# Patient Record
Sex: Male | Born: 1955
Health system: Southern US, Community
[De-identification: ages and names within clinical notes are randomized; demographics above are authoritative.]

## PROBLEM LIST (undated history)

## (undated) DIAGNOSIS — Z87442 Personal history of urinary calculi: Secondary | ICD-10-CM

## (undated) DIAGNOSIS — M1990 Unspecified osteoarthritis, unspecified site: Secondary | ICD-10-CM

## (undated) DIAGNOSIS — I10 Essential (primary) hypertension: Secondary | ICD-10-CM

## (undated) HISTORY — DX: Essential (primary) hypertension: I10

## (undated) HISTORY — PX: KNEE ARTHROSCOPY W/ ACL RECONSTRUCTION: SHX1858

---

## 2001-09-29 ENCOUNTER — Encounter: Payer: Self-pay | Admitting: Urology

## 2001-09-29 ENCOUNTER — Encounter: Admission: RE | Admit: 2001-09-29 | Discharge: 2001-09-29 | Payer: Self-pay | Admitting: Urology

## 2001-10-30 ENCOUNTER — Encounter: Payer: Self-pay | Admitting: Urology

## 2001-10-30 ENCOUNTER — Encounter: Admission: RE | Admit: 2001-10-30 | Discharge: 2001-10-30 | Payer: Self-pay | Admitting: Urology

## 2001-10-31 ENCOUNTER — Encounter: Payer: Self-pay | Admitting: Urology

## 2001-10-31 ENCOUNTER — Encounter: Admission: RE | Admit: 2001-10-31 | Discharge: 2001-10-31 | Payer: Self-pay | Admitting: Urology

## 2001-11-16 ENCOUNTER — Encounter: Payer: Self-pay | Admitting: Urology

## 2001-11-16 ENCOUNTER — Ambulatory Visit (HOSPITAL_BASED_OUTPATIENT_CLINIC_OR_DEPARTMENT_OTHER): Admission: RE | Admit: 2001-11-16 | Discharge: 2001-11-16 | Payer: Self-pay | Admitting: Urology

## 2001-12-04 ENCOUNTER — Encounter: Payer: Self-pay | Admitting: Urology

## 2001-12-04 ENCOUNTER — Encounter: Admission: RE | Admit: 2001-12-04 | Discharge: 2001-12-04 | Payer: Self-pay | Admitting: Urology

## 2001-12-18 ENCOUNTER — Encounter: Payer: Self-pay | Admitting: Urology

## 2001-12-18 ENCOUNTER — Encounter: Admission: RE | Admit: 2001-12-18 | Discharge: 2001-12-18 | Payer: Self-pay | Admitting: Urology

## 2001-12-20 ENCOUNTER — Ambulatory Visit (HOSPITAL_BASED_OUTPATIENT_CLINIC_OR_DEPARTMENT_OTHER): Admission: RE | Admit: 2001-12-20 | Discharge: 2001-12-20 | Payer: Self-pay | Admitting: Urology

## 2001-12-29 ENCOUNTER — Encounter: Admission: RE | Admit: 2001-12-29 | Discharge: 2001-12-29 | Payer: Self-pay | Admitting: Urology

## 2001-12-29 ENCOUNTER — Encounter: Payer: Self-pay | Admitting: Urology

## 2002-01-31 ENCOUNTER — Encounter: Admission: RE | Admit: 2002-01-31 | Discharge: 2002-01-31 | Payer: Self-pay | Admitting: Urology

## 2002-01-31 ENCOUNTER — Encounter: Payer: Self-pay | Admitting: Urology

## 2002-02-19 ENCOUNTER — Encounter: Admission: RE | Admit: 2002-02-19 | Discharge: 2002-02-19 | Payer: Self-pay | Admitting: Urology

## 2002-02-19 ENCOUNTER — Encounter: Payer: Self-pay | Admitting: Urology

## 2002-02-21 ENCOUNTER — Ambulatory Visit (HOSPITAL_BASED_OUTPATIENT_CLINIC_OR_DEPARTMENT_OTHER): Admission: RE | Admit: 2002-02-21 | Discharge: 2002-02-21 | Payer: Self-pay | Admitting: Urology

## 2004-05-23 ENCOUNTER — Emergency Department (HOSPITAL_COMMUNITY): Admission: EM | Admit: 2004-05-23 | Discharge: 2004-05-23 | Payer: Self-pay | Admitting: Emergency Medicine

## 2006-01-26 ENCOUNTER — Ambulatory Visit: Payer: Self-pay | Admitting: Gastroenterology

## 2006-03-22 ENCOUNTER — Ambulatory Visit: Payer: Self-pay | Admitting: Family Medicine

## 2006-04-05 ENCOUNTER — Ambulatory Visit: Payer: Self-pay | Admitting: Family Medicine

## 2006-04-19 ENCOUNTER — Ambulatory Visit: Payer: Self-pay | Admitting: Family Medicine

## 2006-07-05 ENCOUNTER — Emergency Department (HOSPITAL_COMMUNITY): Admission: EM | Admit: 2006-07-05 | Discharge: 2006-07-06 | Payer: Self-pay | Admitting: Emergency Medicine

## 2006-08-03 ENCOUNTER — Ambulatory Visit (HOSPITAL_BASED_OUTPATIENT_CLINIC_OR_DEPARTMENT_OTHER): Admission: RE | Admit: 2006-08-03 | Discharge: 2006-08-03 | Payer: Self-pay | Admitting: Urology

## 2006-09-02 ENCOUNTER — Ambulatory Visit: Payer: Self-pay | Admitting: Family Medicine

## 2006-09-14 ENCOUNTER — Ambulatory Visit: Payer: Self-pay | Admitting: Gastroenterology

## 2006-09-28 ENCOUNTER — Ambulatory Visit: Payer: Self-pay | Admitting: Gastroenterology

## 2006-10-25 ENCOUNTER — Ambulatory Visit: Payer: Self-pay | Admitting: Gastroenterology

## 2006-11-22 ENCOUNTER — Ambulatory Visit: Payer: Self-pay | Admitting: Gastroenterology

## 2006-12-20 ENCOUNTER — Ambulatory Visit: Payer: Self-pay | Admitting: Gastroenterology

## 2007-03-20 ENCOUNTER — Ambulatory Visit: Payer: Self-pay | Admitting: Family Medicine

## 2007-03-20 LAB — CONVERTED CEMR LAB
ALT: 17 units/L (ref 0–40)
AST: 21 units/L (ref 0–37)
Albumin: 3.6 g/dL (ref 3.5–5.2)
Alkaline Phosphatase: 68 units/L (ref 39–117)
BUN: 17 mg/dL (ref 6–23)
Basophils Absolute: 0 10*3/uL (ref 0.0–0.1)
Basophils Relative: 0.6 % (ref 0.0–1.0)
Bilirubin, Direct: 0.1 mg/dL (ref 0.0–0.3)
CO2: 27 meq/L (ref 19–32)
Calcium: 8.9 mg/dL (ref 8.4–10.5)
Chloride: 108 meq/L (ref 96–112)
Cholesterol: 186 mg/dL (ref 0–200)
Creatinine, Ser: 0.9 mg/dL (ref 0.4–1.5)
Direct LDL: 81.3 mg/dL
Eosinophils Absolute: 0.7 10*3/uL — ABNORMAL HIGH (ref 0.0–0.6)
Eosinophils Relative: 9.2 % — ABNORMAL HIGH (ref 0.0–5.0)
GFR calc Af Amer: 114 mL/min
GFR calc non Af Amer: 94 mL/min
Glucose, Bld: 91 mg/dL (ref 70–99)
HCT: 44.5 % (ref 39.0–52.0)
HDL: 35.5 mg/dL — ABNORMAL LOW (ref >39.0)
Hemoglobin: 15.7 g/dL (ref 13.0–17.0)
Lymphocytes Relative: 22.1 % (ref 12.0–46.0)
MCHC: 35.2 g/dL (ref 30.0–36.0)
MCV: 90.9 fL (ref 78.0–100.0)
Monocytes Absolute: 0.7 10*3/uL (ref 0.2–0.7)
Monocytes Relative: 9.6 % (ref 3.0–11.0)
Neutro Abs: 4.1 10*3/uL (ref 1.4–7.7)
Neutrophils Relative %: 58.5 % (ref 43.0–77.0)
PSA: 0.72 ng/mL (ref 0.10–4.00)
Platelets: 265 10*3/uL (ref 150–400)
Potassium: 4.1 meq/L (ref 3.5–5.1)
RBC: 4.9 M/uL (ref 4.22–5.81)
RDW: 11.7 % (ref 11.5–14.6)
Sodium: 141 meq/L (ref 135–145)
TSH: 1.66 microintl units/mL (ref 0.35–5.50)
Total Bilirubin: 0.9 mg/dL (ref 0.3–1.2)
Total CHOL/HDL Ratio: 5.2
Total Protein: 6.9 g/dL (ref 6.0–8.3)
Triglycerides: 388 mg/dL (ref 0–149)
VLDL: 78 mg/dL — ABNORMAL HIGH (ref 0–40)
WBC: 7.1 10*3/uL (ref 4.5–10.5)

## 2007-03-21 ENCOUNTER — Ambulatory Visit: Payer: Self-pay | Admitting: Gastroenterology

## 2007-03-27 ENCOUNTER — Ambulatory Visit: Payer: Self-pay | Admitting: Family Medicine

## 2007-06-21 ENCOUNTER — Ambulatory Visit: Payer: Self-pay | Admitting: Gastroenterology

## 2007-09-14 ENCOUNTER — Ambulatory Visit: Payer: Self-pay | Admitting: Gastroenterology

## 2007-12-11 ENCOUNTER — Ambulatory Visit: Payer: Self-pay | Admitting: Gastroenterology

## 2008-03-25 ENCOUNTER — Ambulatory Visit: Payer: Self-pay | Admitting: Family Medicine

## 2008-03-25 LAB — CONVERTED CEMR LAB
ALT: 20 units/L (ref 0–53)
AST: 20 units/L (ref 0–37)
Albumin: 3.6 g/dL (ref 3.5–5.2)
Alkaline Phosphatase: 68 units/L (ref 39–117)
BUN: 17 mg/dL (ref 6–23)
Basophils Absolute: 0 10*3/uL (ref 0.0–0.1)
Basophils Relative: 0.1 % (ref 0.0–1.0)
Bilirubin Urine: NEGATIVE
Bilirubin, Direct: 0.1 mg/dL (ref 0.0–0.3)
Blood in Urine, dipstick: NEGATIVE
CO2: 28 meq/L (ref 19–32)
Calcium: 9.1 mg/dL (ref 8.4–10.5)
Chloride: 109 meq/L (ref 96–112)
Cholesterol: 186 mg/dL (ref 0–200)
Creatinine, Ser: 1 mg/dL (ref 0.4–1.5)
Direct LDL: 97.7 mg/dL
Eosinophils Absolute: 0.6 10*3/uL (ref 0.0–0.7)
Eosinophils Relative: 8.5 % — ABNORMAL HIGH (ref 0.0–5.0)
GFR calc Af Amer: 101 mL/min
GFR calc non Af Amer: 83 mL/min
Glucose, Bld: 87 mg/dL (ref 70–99)
Glucose, Urine, Semiquant: NEGATIVE
HCT: 46.5 % (ref 39.0–52.0)
HDL: 32.6 mg/dL — ABNORMAL LOW (ref >39.0)
Hemoglobin: 16.1 g/dL (ref 13.0–17.0)
Lymphocytes Relative: 23.1 % (ref 12.0–46.0)
MCHC: 34.6 g/dL (ref 30.0–36.0)
MCV: 93.5 fL (ref 78.0–100.0)
Monocytes Absolute: 0.6 10*3/uL (ref 0.1–1.0)
Monocytes Relative: 8.9 % (ref 3.0–12.0)
Neutro Abs: 4.4 10*3/uL (ref 1.4–7.7)
Neutrophils Relative %: 59.4 % (ref 43.0–77.0)
Nitrite: NEGATIVE
PSA: 0.69 ng/mL (ref 0.10–4.00)
Platelets: 262 10*3/uL (ref 150–400)
Potassium: 4.1 meq/L (ref 3.5–5.1)
Protein, U semiquant: NEGATIVE
RBC: 4.98 M/uL (ref 4.22–5.81)
RDW: 11.8 % (ref 11.5–14.6)
Sodium: 141 meq/L (ref 135–145)
Specific Gravity, Urine: 1.025
TSH: 1.75 microintl units/mL (ref 0.35–5.50)
Total Bilirubin: 0.9 mg/dL (ref 0.3–1.2)
Total CHOL/HDL Ratio: 5.7
Total Protein: 7 g/dL (ref 6.0–8.3)
Triglycerides: 245 mg/dL (ref 0–149)
Urobilinogen, UA: 0.2
VLDL: 49 mg/dL — ABNORMAL HIGH (ref 0–40)
WBC Urine, dipstick: NEGATIVE
WBC: 7.3 10*3/uL (ref 4.5–10.5)
pH: 6

## 2008-04-01 ENCOUNTER — Ambulatory Visit: Payer: Self-pay | Admitting: Family Medicine

## 2008-04-01 DIAGNOSIS — Z87442 Personal history of urinary calculi: Secondary | ICD-10-CM | POA: Insufficient documentation

## 2008-04-01 DIAGNOSIS — I10 Essential (primary) hypertension: Secondary | ICD-10-CM | POA: Insufficient documentation

## 2009-02-28 ENCOUNTER — Ambulatory Visit: Payer: Self-pay | Admitting: Gastroenterology

## 2009-03-14 ENCOUNTER — Ambulatory Visit: Payer: Self-pay | Admitting: Gastroenterology

## 2009-03-14 LAB — HM COLONOSCOPY

## 2009-03-28 ENCOUNTER — Ambulatory Visit: Payer: Self-pay | Admitting: Family Medicine

## 2009-03-28 LAB — CONVERTED CEMR LAB
ALT: 26 units/L (ref 0–53)
AST: 25 units/L (ref 0–37)
Albumin: 3.5 g/dL (ref 3.5–5.2)
Alkaline Phosphatase: 79 units/L (ref 39–117)
BUN: 16 mg/dL (ref 6–23)
Basophils Absolute: 0 10*3/uL (ref 0.0–0.1)
Basophils Relative: 0.3 % (ref 0.0–3.0)
Bilirubin Urine: NEGATIVE
Bilirubin, Direct: 0 mg/dL (ref 0.0–0.3)
CO2: 28 meq/L (ref 19–32)
Calcium: 8.7 mg/dL (ref 8.4–10.5)
Chloride: 109 meq/L (ref 96–112)
Cholesterol: 169 mg/dL (ref 0–200)
Creatinine, Ser: 0.9 mg/dL (ref 0.4–1.5)
Direct LDL: 66.8 mg/dL
Eosinophils Absolute: 0.6 10*3/uL (ref 0.0–0.7)
Eosinophils Relative: 8.6 % — ABNORMAL HIGH (ref 0.0–5.0)
GFR calc non Af Amer: 93.45 mL/min (ref >60)
Glucose, Bld: 88 mg/dL (ref 70–99)
Glucose, Urine, Semiquant: NEGATIVE
HCT: 40.1 % (ref 39.0–52.0)
HDL: 33.9 mg/dL — ABNORMAL LOW (ref >39.00)
Hemoglobin: 13.9 g/dL (ref 13.0–17.0)
Ketones, urine, test strip: NEGATIVE
Lymphocytes Relative: 24.5 % (ref 12.0–46.0)
Lymphs Abs: 1.7 10*3/uL (ref 0.7–4.0)
MCHC: 34.8 g/dL (ref 30.0–36.0)
MCV: 93.8 fL (ref 78.0–100.0)
Monocytes Absolute: 0.7 10*3/uL (ref 0.1–1.0)
Monocytes Relative: 10.2 % (ref 3.0–12.0)
Neutro Abs: 3.9 10*3/uL (ref 1.4–7.7)
Neutrophils Relative %: 56.4 % (ref 43.0–77.0)
Nitrite: NEGATIVE
PSA: 0.57 ng/mL (ref 0.10–4.00)
Platelets: 282 10*3/uL (ref 150.0–400.0)
Potassium: 3.8 meq/L (ref 3.5–5.1)
RBC: 4.27 M/uL (ref 4.22–5.81)
RDW: 12.5 % (ref 11.5–14.6)
Sodium: 143 meq/L (ref 135–145)
Specific Gravity, Urine: 1.015
TSH: 1.42 microintl units/mL (ref 0.35–5.50)
Total Bilirubin: 0.7 mg/dL (ref 0.3–1.2)
Total CHOL/HDL Ratio: 5
Total Protein: 6.8 g/dL (ref 6.0–8.3)
Triglycerides: 342 mg/dL — ABNORMAL HIGH (ref 0.0–149.0)
Urobilinogen, UA: 1
VLDL: 68.4 mg/dL — ABNORMAL HIGH (ref 0.0–40.0)
WBC Urine, dipstick: NEGATIVE
WBC: 6.9 10*3/uL (ref 4.5–10.5)
pH: 7.5

## 2009-04-03 ENCOUNTER — Encounter: Payer: Self-pay | Admitting: Family Medicine

## 2009-04-04 ENCOUNTER — Ambulatory Visit: Payer: Self-pay | Admitting: Family Medicine

## 2009-04-04 DIAGNOSIS — M722 Plantar fascial fibromatosis: Secondary | ICD-10-CM | POA: Insufficient documentation

## 2009-04-29 ENCOUNTER — Ambulatory Visit: Payer: Self-pay | Admitting: Sports Medicine

## 2009-04-29 DIAGNOSIS — M2140 Flat foot [pes planus] (acquired), unspecified foot: Secondary | ICD-10-CM | POA: Insufficient documentation

## 2010-03-06 ENCOUNTER — Ambulatory Visit: Payer: Self-pay | Admitting: Family Medicine

## 2010-03-06 LAB — CONVERTED CEMR LAB
ALT: 22 units/L (ref 0–53)
AST: 22 units/L (ref 0–37)
Albumin: 3.9 g/dL (ref 3.5–5.2)
Alkaline Phosphatase: 73 units/L (ref 39–117)
BUN: 18 mg/dL (ref 6–23)
Basophils Absolute: 0 10*3/uL (ref 0.0–0.1)
Basophils Relative: 0.4 % (ref 0.0–3.0)
Bilirubin Urine: NEGATIVE
Bilirubin, Direct: 0.1 mg/dL (ref 0.0–0.3)
Blood in Urine, dipstick: NEGATIVE
CO2: 28 meq/L (ref 19–32)
Calcium: 9 mg/dL (ref 8.4–10.5)
Chloride: 104 meq/L (ref 96–112)
Cholesterol: 198 mg/dL (ref 0–200)
Creatinine, Ser: 0.9 mg/dL (ref 0.4–1.5)
Direct LDL: 120 mg/dL
Eosinophils Absolute: 0.4 10*3/uL (ref 0.0–0.7)
Eosinophils Relative: 4.6 % (ref 0.0–5.0)
GFR calc non Af Amer: 93.13 mL/min (ref >60)
Glucose, Bld: 84 mg/dL (ref 70–99)
Glucose, Urine, Semiquant: NEGATIVE
HCT: 45 % (ref 39.0–52.0)
HDL: 40.7 mg/dL (ref >39.00)
Hemoglobin: 16 g/dL (ref 13.0–17.0)
Ketones, urine, test strip: NEGATIVE
Lymphocytes Relative: 22.6 % (ref 12.0–46.0)
Lymphs Abs: 2 10*3/uL (ref 0.7–4.0)
MCHC: 35.6 g/dL (ref 30.0–36.0)
MCV: 92.6 fL (ref 78.0–100.0)
Monocytes Absolute: 0.7 10*3/uL (ref 0.1–1.0)
Monocytes Relative: 7.9 % (ref 3.0–12.0)
Neutro Abs: 5.7 10*3/uL (ref 1.4–7.7)
Neutrophils Relative %: 64.5 % (ref 43.0–77.0)
Nitrite: NEGATIVE
PSA: 0.7 ng/mL (ref 0.10–4.00)
Platelets: 267 10*3/uL (ref 150.0–400.0)
Potassium: 4.2 meq/L (ref 3.5–5.1)
RBC: 4.86 M/uL (ref 4.22–5.81)
RDW: 13.1 % (ref 11.5–14.6)
Sodium: 136 meq/L (ref 135–145)
Specific Gravity, Urine: 1.02
TSH: 1.37 microintl units/mL (ref 0.35–5.50)
Total Bilirubin: 0.6 mg/dL (ref 0.3–1.2)
Total CHOL/HDL Ratio: 5
Total Protein: 7.1 g/dL (ref 6.0–8.3)
Triglycerides: 215 mg/dL — ABNORMAL HIGH (ref 0.0–149.0)
Urobilinogen, UA: 0.2
VLDL: 43 mg/dL — ABNORMAL HIGH (ref 0.0–40.0)
WBC Urine, dipstick: NEGATIVE
WBC: 8.8 10*3/uL (ref 4.5–10.5)
pH: 7

## 2010-03-16 ENCOUNTER — Telehealth (INDEPENDENT_AMBULATORY_CARE_PROVIDER_SITE_OTHER): Payer: Self-pay | Admitting: *Deleted

## 2010-03-16 ENCOUNTER — Ambulatory Visit: Payer: Self-pay | Admitting: Family Medicine

## 2010-03-16 DIAGNOSIS — K644 Residual hemorrhoidal skin tags: Secondary | ICD-10-CM | POA: Insufficient documentation

## 2010-03-16 DIAGNOSIS — H9190 Unspecified hearing loss, unspecified ear: Secondary | ICD-10-CM | POA: Insufficient documentation

## 2010-03-30 ENCOUNTER — Ambulatory Visit: Payer: Self-pay | Admitting: Family Medicine

## 2010-03-30 DIAGNOSIS — H6120 Impacted cerumen, unspecified ear: Secondary | ICD-10-CM | POA: Insufficient documentation

## 2010-04-16 ENCOUNTER — Ambulatory Visit: Payer: Self-pay | Admitting: Sports Medicine

## 2010-04-16 DIAGNOSIS — M2040 Other hammer toe(s) (acquired), unspecified foot: Secondary | ICD-10-CM | POA: Insufficient documentation

## 2010-04-16 DIAGNOSIS — R269 Unspecified abnormalities of gait and mobility: Secondary | ICD-10-CM | POA: Insufficient documentation

## 2010-04-16 DIAGNOSIS — M79609 Pain in unspecified limb: Secondary | ICD-10-CM | POA: Insufficient documentation

## 2011-01-05 NOTE — Procedures (Signed)
Summary: Colonoscopy   Colonoscopy  Procedure date:  03/14/2009  Findings:      Location:  [facility].    Procedures Next Due Date:    Colonoscopy: 03/2014  COLONOSCOPY PROCEDURE REPORT  PATIENT:  John Buck, John Buck  MR#:  [id number] BIRTHDATE:   [birth year], 52 yrs. old   GENDER:   male  ENDOSCOPIST:   Barbette Hair. Arlyce Dice, MD Referred by: Eugenio Hoes Tawanna Cooler, M.D.  PROCEDURE DATE:  03/14/2009 PROCEDURE:  Colonoscopy, diagnostic ASA CLASS:   Class I INDICATIONS: family history of colon cancer Father  MEDICATIONS:    Fentanyl 50 mcg IV, Versed 5 mg IV  DESCRIPTION OF PROCEDURE:   After the risks benefits and alternatives of the procedure were thoroughly explained, informed consent was obtained.  Digital rectal exam was performed and revealed no abnormalities.   The LB [id number] endoscope was introduced through the anus and advanced to the cecum, which was identified by both the appendix and ileocecal valve, without limitations.  The quality of the prep was excellent, using MiraLax.  The instrument was then slowly withdrawn as the colon was fully examined. <<PROCEDUREIMAGES>>                        <<OLD IMAGES>>  FINDINGS:  A normal appearing cecum, ileocecal valve, and appendiceal orifice were identified. The ascending, transverse, descending, sigmoid colon, and rectum appeared unremarkable (see image1, image2, image3, image4, image5, image6, image7, [place], [place], image10, [place], [place], and image13).   Retroflexed views in the rectum revealed no abnormalities.    The scope was then withdrawn from the patient and the procedure completed.  COMPLICATIONS:   None  ENDOSCOPIC IMPRESSION:  1) Normal colon RECOMMENDATIONS:  1) Given your significant family history of colon cancer, you should have a repeat colonoscopy in 5 years  REPEAT EXAM:   In 5 year(s) for Colonoscopy.   _______________________________ Barbette Hair. Arlyce Dice, MD  CC: Eugenio Hoes. Tawanna Cooler, MD

## 2011-01-05 NOTE — Assessment & Plan Note (Signed)
Summary: NP WITH L HEEL PAIN X JULY   Vital Signs:  Patient profile:   55 year old male Height:      70 inches Weight:      215 pounds BP sitting:   120 / 80  Vitals Entered By: Lillia Pauls CMA (Apr 29, 2009 2:14 PM)  History of Present Illness: L heel pain since july '09. Saw dr. supple and was treated with cortisone injection which only provided a few weeks of relief.  He saw dr. Tawanna Cooler who sent him to PT at [facility] for past 3 weeks.  He is doing heel stretches, u/s, iontophoresis, but is still symptomatic.  Pain first thing in the morning.  Job requires steel toed shoes at work.  Hx of plantar fasciitis in R foot, currently asymptomatic.  he notes some recent weight gain over the past few months due to inability to exercise due to the pain.  Allergies: 1)  ! Stadol (Butorphanol Tartrate) 2)  ! Dilaudid (Hydromorphone Hcl)  Past History:  Past Medical History:    Hypertension    Nephrolithiasis, hx of     (04/01/2008)  Physical Exam  General:  Well-developed,well-nourished,in no acute distress; alert,appropriate and cooperative throughout examination Msk:  L foot with TTP at base of plantar fascia.  Plantar fascia intact.  Negative calcaneal squeeze.  R foot without any swelling over the plantar fascia.  Leg lengths equal 5/5 hip abductor strength  feet with b/l longitudinal arch collapse.  L foot with splaying between 1st and 2nd toe. 4th metatarsal head subluxed.  R foot with splaying between 2nd and 3rd toes.  4th toe turned inward with metatarsal head subluxed. bilateral morton's callus. No hallux rigidis. Posterior tibialis function intact. Pulses:  2+ dp pulses Extremities:  no edema Neurologic:  intact sensation in both feet Skin:  Intact without suspicious lesions or rashes Additional Exam:  MSK u/s- L plantar fascia 0.70 cm thick with edema, but no tearing.  Tendon thins out as moving distal.  No neovessles.  R plantar fascia 0.50 cm thick with calcaneal spur  noted.  Images have been saved to computer and are available for review.    Impression & Recommendations:  Problem # 1:  PLANTAR FASCIITIS, LEFT (ICD-728.71) Assessment New diagnosis confirmed with ultrasound findings. Continue stretching program and icing.  will add custom orthotics given significant foot breakdown (both longitudinal and transverse arch). Patient was fitted for a : standard, cushioned, semi-rigid orthotic. The orthotic was heated and afterward the patient stood on the orthotic blank positioned on the orthotic stand. The patient was positioned in subtalar neutral position and 10 degrees of ankle dorsiflexion in a weight bearing stance. After completion of molding, a stable base was applied to the orthotic blank. The blank was ground to a stable position for weight bearing. Size: 13 black insert with white stripes Base: large blue foam Posting: none Additional orthotic padding: metatarsal pads bilaterally  Time spent with patient evaluating and preparing orthotics > 45 minutes  Orders: Orthotic Materials, each unit (L3002) US EXTREMITY NON-VASC REAL-TIME IMG (65784)  Problem # 2:  PES PLANUS (ICD-734) Assessment: New  Orthotics will help correct this  Orders: Orthotic Materials, each unit (L3002)  Complete Medication List: 1)  Hydrochlorothiazide 25 Mg Tabs (Hydrochlorothiazide) .... 1/2tab once daily 2)  Aspirin Buffered 325 Mg Tabs (Aspirin buff(mgcarb-alaminoac)) .... Once daily

## 2011-01-05 NOTE — Assessment & Plan Note (Signed)
Summary: ORTHOTICS,MC   Vital Signs:  Patient profile:   55 year old male BP sitting:   121 / 78  Vitals Entered By: Lillia Pauls CMA (Apr 16, 2010 2:40 PM)  History of Present Illness: John Buck returns to get orthotics for his exercise shoes He saw me last year w bilat foot pain at heels had had RX w injections. PT , etc and no relief states that within 1 wk of orthotics had great relief of pain heel pain totally gone in < month  since then won't work on hard floors without orthotics feet tire quickly when he does not use them  mother w severe hammer toes  he feels these are not as bad for him as before since using support  Allergies: 1)  ! Stadol (Butorphanol Tartrate) 2)  ! Dilaudid (Hydromorphone Hcl)  Physical Exam  General:  obese,well-nourished,in no acute distress; alert,appropriate and cooperative throughout examination Msk:  collcpse of long arch w pronation bilat heel is non tender now early hammer toes bilat more over 3 and 4 callus over lat MT heads  walking gait pronates   Impression & Recommendations:  Problem # 1:  ABNORMALITY OF GAIT (ICD-781.2) Patient was fitted for a standard, cushioned, semi-rigid orthotic.  The orthotic was heated and the patient stood on the orthotic blank positioned on the orthotic stand. The patient was positioned in subtalar neutral position and 10 degrees of ankle dorsiflexion in a weight bearing stance. After completion of molding a stable based was applied to the orthotic blank.   The blank was ground to a stable position for weight bearing. size 12 blue leather base large blue EVA med density posting MT pads bilat additional orthotic padding none  40 mins  Problem # 2:  FOOT PAIN, BILATERAL (ICD-729.5) much improved in orthotics  use these whenever walking or standing for a long period  Problem # 3:  PLANTAR FASCIITIS, LEFT (ICD-728.71) resolved at this point  Problem # 4:  HAMMER TOE, ACQUIRED (ICD-735.4) hammer  toes coming from loss of transverse arch  we will keep using MT pads to lessen this collapse  will reck prn  Complete Medication List: 1)  Hydrochlorothiazide 25 Mg Tabs (Hydrochlorothiazide) .... 1/2tab once daily 2)  Aspirin Buffered 325 Mg Tabs (Aspirin buff(mgcarb-alaminoac)) .... Once daily 3)  Anusol-hc 2.5 % Crea (Hydrocortisone) .... Apply at bedtime  Appended Document: Orders Update    Clinical Lists Changes  Orders: Added new Service order of Est. Patient Level IV ([address]) - Signed Added new Service order of Orthotic Materials, each unit [phone]) - Signed

## 2011-01-05 NOTE — Miscellaneous (Signed)
Summary: [facility] PV  Clinical Lists Changes  Medications: Added new medication of MIRALAX   POWD (POLYETHYLENE GLYCOL 3350) As per prep  instructions. - Signed Added new medication of DULCOLAX 5 MG  TBEC (BISACODYL) Day before procedure take 2 at 3pm and 2 at 8pm. - Signed Added new medication of REGLAN 10 MG  TABS (METOCLOPRAMIDE HCL) As per prep instructions. - Signed Rx of MIRALAX   POWD (POLYETHYLENE GLYCOL 3350) As per prep  instructions.;  #255gm x 0;  Signed;  Entered by: Ezra Sites RN;  Authorized by: Louis Meckel MD;  Method used: [id number] [address] [place], [place], [place]  [address], Ph: [phone] or [phone], Fax: [phone] Rx of DULCOLAX 5 MG  TBEC (BISACODYL) Day before procedure take 2 at 3pm and 2 at 8pm.;  #4 x 0;  Signed;  Entered by: Ezra Sites RN;  Authorized by: Louis Meckel MD;  Method used: [id number] [address], [place], [place], [place]  [address], Ph: [phone] or [phone], Fax: [phone] Rx of REGLAN 10 MG  TABS (METOCLOPRAMIDE HCL) As per prep instructions.;  #2 x 0;  Signed;  Entered by: Ezra Sites RN;  Authorized by: Louis Meckel MD;  Method used: [facility]ically [id number] [address] [place], [place], [place]  [address], Ph: [phone] or [phone], Fax: [phone] Observations: Added new observation of ALLERGY REV: Done (02/28/2009 13:27)    Prescriptions: REGLAN 10 MG  TABS (METOCLOPRAMIDE HCL) As per prep instructions.  #2 x 0   Entered by:   Ezra Sites RN   Authorized by:   Louis Meckel MD   Signed by:   Ezra Sites RN on 02/28/2009   Method used:   [facility]        [id number]       [address]       [place], [place]  [address]       Ph: [phone] or [phone]       Fax: [phone]   RxID:   [id number] DULCOLAX 5 MG  TBEC (BISACODYL) Day  before procedure take 2 at 3pm and 2 at 8pm.  #4 x 0   Entered by:   Ezra Sites RN   Authorized by:   Louis Meckel MD   Signed by:   Ezra Sites RN on 02/28/2009   Method used:   [facility]        [facility] [phone][facility] (retail)       [address]       [place], [place]  [address]       Ph: [phone] or [phone]       Fax: [phone]   RxID:   [id number] MIRALAX   POWD (POLYETHYLENE GLYCOL 3350) As per prep  instructions.  #255gm x 0   Entered by:   Ezra Sites RN   Authorized by:   Louis Meckel MD   Signed by:   Ezra Sites RN on 02/28/2009   Method used:   [facility]        [facility] [address] [id number]       [address] [facility]  [place], [place]  [address]       Ph: [phone] or [phone]       Fax: [phone]   RxID:   [id number]

## 2011-01-05 NOTE — Assessment & Plan Note (Signed)
Summary: cpx/njr   Vital Signs:  Patient Profile:   55 Years Old Male Height:     70 inches Weight:      201 pounds Temp:     98.3 degrees F Pulse rate:   70 / minute Pulse rhythm:   regular BP sitting:   110 / 78  Pt. in pain?   no  Vitals Entered By: Sindy Guadeloupe RN (April 01, 2008 2:03 PM)                  Chief Complaint:  cpx.  History of Present Illness: John Buck is a 55 year old, married male, who works at [employer], who comes in today for physical exam.  He has always been in good health.  He has no major problems except for some mild hypertension for which he takes HCTZ, 25 mg q.a.m.  BP today 110/78.  A year ago.  He took off a dysplastic nevus of his back as part of his exam will do a complete skin exam.  He is up on his health maintenance.  Activities.  Will get a colonoscopy next year.  He had 105  He had the cartilage in his right knee removed actually said surgeon at right knee 5 times.  He was involved with a study drug for IBS.  His past 45, kidney stones.  Last tetanus shot was 08.    Current Allergies (reviewed today): ! STADOL (BUTORPHANOL TARTRATE) ! DILAUDID (HYDROMORPHONE HCL)  Past Medical History:    Reviewed history and no changes required:       Hypertension       Nephrolithiasis, hx of   Family History:    Reviewed history from 07/21/2007 and no changes required:       Family History Hypertension       Family History Other cancer-colon       Family History Seizures  Social History:    Reviewed history from 07/21/2007 and no changes required:       Married       Former Smoker       Occupation:       Never Smoked       Alcohol use-no       Drug use-no       Regular exercise-yes   Risk Factors:  Tobacco use:  never Drug use:  no Alcohol use:  no Exercise:  yes   Review of Systems      See HPI   Physical Exam  General:     Well-developed,well-nourished,in no acute distress; alert,appropriate and cooperative  throughout examination Head:     Normocephalic and atraumatic without obvious abnormalities. No apparent alopecia or balding. Eyes:     No corneal or conjunctival inflammation noted. EOMI. Perrla. Funduscopic exam benign, without hemorrhages, exudates or papilledema. Vision grossly normal. Ears:     ear canals, normal.  Drums not able to be visualized.  His bilateral cerumen impactions were taken down.  The lab and suction out wax Nose:     External nasal examination shows no deformity or inflammation. Nasal mucosa are pink and moist without lesions or exudates. Mouth:     Oral mucosa and oropharynx without lesions or exudates.  Teeth in good repair. Neck:     No deformities, masses, or tenderness noted. Chest Wall:     No deformities, masses, tenderness or gynecomastia noted. Breasts:     No masses or gynecomastia noted Lungs:     Normal respiratory effort, chest expands  symmetrically. Lungs are clear to auscultation, no crackles or wheezes. Heart:     Normal rate and regular rhythm. S1 and S2 normal without gallop, murmur, click, rub or other extra sounds. Abdomen:     Bowel sounds positive,abdomen soft and non-tender without masses, organomegaly or hernias noted. Rectal:     No external abnormalities noted. Normal sphincter tone. No rectal masses or tenderness. Genitalia:     Testes bilaterally descended without nodularity, tenderness or masses. No scrotal masses or lesions. No penis lesions or urethral discharge. Prostate:     Prostate gland firm and smooth, no enlargement, nodularity, tenderness, mass, asymmetry or induration. Msk:     No deformity or scoliosis noted of thoracic or lumbar spine.   Pulses:     R and L carotid,radial,femoral,dorsalis pedis and posterior tibial pulses are full and equal bilaterally Extremities:     No clubbing, cyanosis, edema, or deformity noted with normal full range of motion of all joints.   Neurologic:     No cranial nerve deficits noted.  Station and gait are normal. Plantar reflexes are down-going bilaterally. DTRs are symmetrical throughout. Sensory, motor and coordinative functions appear intact. Skin:     Intact without suspicious lesions or rashes Cervical Nodes:     No lymphadenopathy noted Axillary Nodes:     No palpable lymphadenopathy Inguinal Nodes:     No significant adenopathy Psych:     Cognition and judgment appear intact. Alert and cooperative with normal attention span and concentration. No apparent delusions, illusions, hallucinations    Impression & Recommendations:  Problem # 1:  NEPHROLITHIASIS, HX OF (ICD-V13.01) Assessment: Improved  Complete Medication List: 1)  Hydrochlorothiazide 25 Mg Tabs (Hydrochlorothiazide) .... 1/2tab once daily 2)  Aspirin Buffered 325 Mg Tabs (Aspirin buff(mgcarb-alaminoac)) .... Once daily  Other Orders: EKG w/ Interpretation (93000)   Patient Instructions: 1)  Please schedule a follow-up appointment in 1 year.    Prescriptions: HYDROCHLOROTHIAZIDE 25 MG  TABS (HYDROCHLOROTHIAZIDE) 1/2tab once daily  #100 x 4   Entered and Authorized by:   Roderick Pee MD   Signed by:   Roderick Pee MD on 04/01/2008   Method used:   [facility]ron[facility] to ...       [id number]       [address]       [place], [place]  [address]       Ph: [phone] or [phone]       Fax: [phone]   RxID:   [id number]  ]

## 2011-01-05 NOTE — Progress Notes (Signed)
Summary: need rx for eardrops  Phone Note Call from Patient Call back at [phone]   Caller: Patient---live call Summary of Call: was seen this morning. Need the status of his ear drops. [facility]. Initial call taken by: Warnell Forester,  March 16, 2010 1:31 PM  Follow-up for Phone Call        OTC !!!!!!!!!!!!!!!!!!! Follow-up by: Roderick Pee MD,  March 16, 2010 1:54 PM  Additional Follow-up for Phone Call Additional follow up Details #1::        Phone Call Completed Additional Follow-up by: Kern Reap CMA Duncan Dull),  March 16, 2010 4:27 PM

## 2011-01-05 NOTE — Assessment & Plan Note (Signed)
Summary: cpx//ccm   Vital Signs:  Patient profile:   55 year old male Height:      70 inches Weight:      214 pounds BMI:     30.82 Temp:     98.1 degrees F oral BP sitting:   120 / 80  (left arm) Cuff size:   regular  Vitals Entered By: Kern Reap CMA Duncan Dull) (March 16, 2010 9:14 AM) CC: cpx Is Patient Diabetic? No Pain Assessment Patient in pain? no        CC:  cpx.  History of Present Illness: John Buck is a 55 year old, married male, who comes in today for physical exam.  He has mild hyaline hypertension, for which he takes hydrochlorothiazide 12.5 mg daily.  BP 120/80.  He also takes an aspirin tablet daily to  He is having trouble with his right ear.  He has a sensation of fullness and decreased hearing.  He states his left ear is normal.  Six months ago.  He popped an ext. , hemorrhoid it's been healing, but slowly.  It's been painful, but no bleeding.colonoscopy normal in GI    Allergies: 1)  ! Stadol (Butorphanol Tartrate) 2)  ! Dilaudid (Hydromorphone Hcl)  Past History:  Past medical, surgical, family and social histories (including risk factors) reviewed, and no changes noted (except as noted below).  Past Medical History: Reviewed history from 04/01/2008 and no changes required. Hypertension Nephrolithiasis, hx of  Past Surgical History: Reviewed history from 07/21/2007 and no changes required. Colonoscopy-04/23/2004  Family History: Reviewed history from 07/21/2007 and no changes required. Family History Hypertension Family History Other cancer-colon Family History Seizures  Social History: Reviewed history from 04/01/2008 and no changes required. Married Former Smoker Occupation: Never Smoked Alcohol use-no Drug use-no Regular exercise-yes  Review of Systems      See HPI  Physical Exam  General:  Well-developed,well-nourished,in no acute distress; alert,appropriate and cooperative throughout examination Head:  Normocephalic and  atraumatic without obvious abnormalities. No apparent alopecia or balding. Eyes:  No corneal or conjunctival inflammation noted. EOMI. Perrla. Funduscopic exam benign, without hemorrhages, exudates or papilledema. Vision grossly normal. Ears:  External ear exam shows no significant lesions or deformities.  Otoscopic examination reveals clear canals, tympanic membranes are intact bilaterally without bulging, retraction, inflammation or discharge. Hearing is grossly normal bilaterally. Nose:  External nasal examination shows no deformity or inflammation. Nasal mucosa are pink and moist without lesions or exudates. Mouth:  Oral mucosa and oropharynx without lesions or exudates.  Teeth in good repair. Neck:  No deformities, masses, or tenderness noted. Chest Wall:  No deformities, masses, tenderness or gynecomastia noted. Breasts:  No masses or gynecomastia noted Lungs:  Normal respiratory effort, chest expands symmetrically. Lungs are clear to auscultation, no crackles or wheezes. Heart:  Normal rate and regular rhythm. S1 and S2 normal without gallop, murmur, click, rub or other extra sounds. Abdomen:  Bowel sounds positive,abdomen soft and non-tender without masses, organomegaly or hernias noted. Rectal:  on inspection, the external rectum appears normal.  I do not appreciate a hemorrhoid.  Internal exam negative Genitalia:  Testes bilaterally descended without nodularity, tenderness or masses. No scrotal masses or lesions. No penis lesions or urethral discharge. Prostate:  Prostate gland firm and smooth, no enlargement, nodularity, tenderness, mass, asymmetry or induration. Msk:  No deformity or scoliosis noted of thoracic or lumbar spine.   Pulses:  R and L carotid,radial,femoral,dorsalis pedis and posterior tibial pulses are full and equal bilaterally Extremities:  No clubbing,  cyanosis, edema, or deformity noted with normal full range of motion of all joints.   Neurologic:  No cranial nerve  deficits noted. Station and gait are normal. Plantar reflexes are down-going bilaterally. DTRs are symmetrical throughout. Sensory, motor and coordinative functions appear intact. Skin:  Intact without suspicious lesions or rashes Cervical Nodes:  No lymphadenopathy noted Axillary Nodes:  No palpable lymphadenopathy Inguinal Nodes:  No significant adenopathy Psych:  Cognition and judgment appear intact. Alert and cooperative with normal attention span and concentration. No apparent delusions, illusions, hallucinations   Impression & Recommendations:  Problem # 1:  HYPERTENSION (ICD-401.9) Assessment Improved  His updated medication list for this problem includes:    Hydrochlorothiazide 25 Mg Tabs (Hydrochlorothiazide) .Marland Kitchen... 1/2tab once daily  Orders: Prescription Created Electronically [phone]) EKG w/ Interpretation ([address])  Problem # 2:  Preventive Health Care (ICD-V70.0) Assessment: Unchanged  Problem # 3:  HEARING LOSS, RIGHT EAR (ICD-389.9) Assessment: New  Orders: Prescription Created Electronically [phone])  Problem # 4:  HEMORRHOIDS, EXTERNAL (ICD-455.3) Assessment: New  Orders: Prescription Created Electronically [phone])  Complete Medication List: 1)  Hydrochlorothiazide 25 Mg Tabs (Hydrochlorothiazide) .... 1/2tab once daily 2)  Aspirin Buffered 325 Mg Tabs (Aspirin buff(mgcarb-alaminoac)) .... Once daily 3)  Anusol-hc 2.5 % Crea (Hydrocortisone) .... Apply at bedtime  Patient Instructions: 1)  use the earwax dissolving drops nightly, x 2 weeks.  Return in two weeks to have earwax removed by me. 2)  Also apply the medicated ointment to the rectum nightly.  If this not work then we need to refer you back to GI 3)  Please schedule a follow-up appointment in 1 year. 4)  It is important that you exercise regularly at least 20 minutes 5 times a week. If you develop chest pain, have severe difficulty breathing, or feel very tired , stop exercising immediately and seek  medical attention. 5)  Take an Aspirin every day. Prescriptions: HYDROCHLOROTHIAZIDE 25 MG  TABS (HYDROCHLOROTHIAZIDE) 1/2tab once daily  #100 x 4   Entered and Authorized by:   Roderick Pee MD   Signed by:   Roderick Pee MD on 03/16/2010   Method used:   [facility]        [facility] [address] [phone]* (retail)       [address]       [place], [place]  [address]       Ph: [phone]       Fax: [phone]   RxID:   [id number] ANUSOL-HC 2.5 % CREA (HYDROCORTISONE) apply at bedtime  #30 gr x 3   Entered and Authorized by:   Roderick Pee MD   Signed by:   Roderick Pee MD on 03/16/2010   Method used:   [facility]        [facility] [address] [phone]* (retail)       [address]       [place], [place]  [address]       Ph: [phone]       Fax: [phone]   RxID:   [id number]

## 2011-01-05 NOTE — Assessment & Plan Note (Signed)
Summary: CPX/MHF   Vital Signs:  Patient profile:   55 year old male Height:      70 inches Weight:      216 pounds BMI:     31.10 Temp:     97.7 degrees F oral BP sitting:   120 / 84  (left arm) Cuff size:   regular  Vitals Entered By: Kern Reap CMA (April 04, 2009 8:28 AM)  Reason for Visit cpx  History of Present Illness: John Buck is a 55 year old male, who comes in today for evaluation.  He has underlying hypertension, for which she takes 12.5 mg of HCTZ daily.  BP 120/84.  He gets routine eye care and dental care.  Last colonoscopy 2010.  Normal.  Recommended repeat Q5 years because father had colon cancer.  Last tetanus booster was 2007 he declines yearly flu shots.  A new problem is pain in his left heel.  He's been diagnosed with plantar fasciitis has numerous podiatry and orthopedic treatments none of which have helped.  He has a history of kidney stones.  He is currently asymptomatic.  Is mild hypertension.  He takes 25 mg of HCTZ one half tablet q.a.m., BP 120/84.  He takes a baby aspirin every day.  He gets routine eye care and dental care.  Colonoscopy in 2010 it was normal.  They recommended q.5 year follow-up because his father had colon cancer.  A new problem is plantar fasciitis, left foot Allergies: 1)  ! Stadol (Butorphanol Tartrate) 2)  ! Dilaudid (Hydromorphone Hcl)  Past History:  Past medical, surgical, family and social histories (including risk factors) reviewed, and no changes noted (except as noted below).  Past Medical History:    Reviewed history from 04/01/2008 and no changes required:    Hypertension    Nephrolithiasis, hx of  Past Surgical History:    Reviewed history from 07/21/2007 and no changes required:    Colonoscopy-04/23/2004  Family History:    Reviewed history from 07/21/2007 and no changes required:       Family History Hypertension       Family History Other cancer-colon       Family History Seizures  Social  History:    Reviewed history from 04/01/2008 and no changes required:       Married       Former Smoker       Occupation:       Never Smoked       Alcohol use-no       Drug use-no       Regular exercise-yes  Review of Systems      See HPI  Physical Exam  General:  Well-developed,well-nourished,in no acute distress; alert,appropriate and cooperative throughout examination Head:  Normocephalic and atraumatic without obvious abnormalities. No apparent alopecia or balding. Eyes:  No corneal or conjunctival inflammation noted. EOMI. Perrla. Funduscopic exam benign, without hemorrhages, exudates or papilledema. Vision grossly normal. Ears:  External ear exam shows no significant lesions or deformities.  Otoscopic examination reveals clear canals, tympanic membranes are intact bilaterally without bulging, retraction, inflammation or discharge. Hearing is grossly normal bilaterally. Nose:  External nasal examination shows no deformity or inflammation. Nasal mucosa are pink and moist without lesions or exudates. Mouth:  Oral mucosa and oropharynx without lesions or exudates.  Teeth in good repair. Neck:  No deformities, masses, or tenderness noted. Chest Wall:  No deformities, masses, tenderness or gynecomastia noted. Breasts:  No masses or gynecomastia noted Lungs:  Normal respiratory effort, chest  expands symmetrically. Lungs are clear to auscultation, no crackles or wheezes. Heart:  Normal rate and regular rhythm. S1 and S2 normal without gallop, murmur, click, rub or other extra sounds. Abdomen:  Bowel sounds positive,abdomen soft and non-tender without masses, organomegaly or hernias noted. Rectal:  No external abnormalities noted. Normal sphincter tone. No rectal masses or tenderness. Genitalia:  Testes bilaterally descended without nodularity, tenderness or masses. No scrotal masses or lesions. No penis lesions or urethral discharge. Prostate:  Prostate gland firm and smooth, no  enlargement, nodularity, tenderness, mass, asymmetry or induration. Msk:  No deformity or scoliosis noted of thoracic or lumbar spine.   Pulses:  R and L carotid,radial,femoral,dorsalis pedis and posterior tibial pulses are full and equal bilaterally Extremities:  No clubbing, cyanosis, edema, or deformity noted with normal full range of motion of all joints.   Neurologic:  No cranial nerve deficits noted. Station and gait are normal. Plantar reflexes are down-going bilaterally. DTRs are symmetrical throughout. Sensory, motor and coordinative functions appear intact. Skin:  Intact without suspicious lesions or rashes Cervical Nodes:  No lymphadenopathy noted Axillary Nodes:  No palpable lymphadenopathy Inguinal Nodes:  No significant adenopathy Psych:  Cognition and judgment appear intact. Alert and cooperative with normal attention span and concentration. No apparent delusions, illusions, hallucinations   Impression & Recommendations:  Problem # 1:  HYPERTENSION (ICD-401.9) Assessment Improved  His updated medication list for this problem includes:    Hydrochlorothiazide 25 Mg Tabs (Hydrochlorothiazide) .Marland Kitchen... 1/2tab once daily  Orders: Prescription Created Electronically [phone]) EKG w/ Interpretation (93000)  Problem # 2:  PLANTAR FASCIITIS, LEFT (ICD-728.71) Assessment: New  Orders: Prescription Created Electronically [id number]) Physical Therapy Referral (PT)  Complete Medication List: 1)  Hydrochlorothiazide 25 Mg Tabs (Hydrochlorothiazide) .... 1/2tab once daily 2)  Aspirin Buffered 325 Mg Tabs (Aspirin buff(mgcarb-alaminoac)) .... Once daily  Patient Instructions: 1)  we will such up an appointment to see Jeanene Erb to treat your plantar fasciitis. 2)  Continue current medications. Prescriptions: HYDROCHLOROTHIAZIDE 25 MG  TABS (HYDROCHLOROTHIAZIDE) 1/2tab once daily  #100 x 4   Entered and Authorized by:   Roderick Pee MD   Signed by:   Roderick Pee MD on  04/04/2009   Method used:   Electronically to        [facility]  [address] [phone]* (retail)       [address]       [place], [place]  [address]       Ph: [phone] or [phone]       Fax: [phone]   RxID:   [id number]      Immunization History:  Tetanus/Td Immunization History:    Tetanus/Td:  historical (12/06/2005)

## 2011-01-05 NOTE — Assessment & Plan Note (Signed)
Summary: 2 week fup//ccm   History of Present Illness: John Buck is a 55 year old male, who returns today because of bilateral cerumen impactions, and hearing loss  Allergies: 1)  ! Stadol (Butorphanol Tartrate) 2)  ! Dilaudid (Hydromorphone Hcl)  Physical Exam  General:  Well-developed,well-nourished,in no acute distress; alert,appropriate and cooperative throughout examination Ears:  bilateral cerumen impactions removed by irrigation   Impression & Recommendations:  Problem # 1:  IMPACTED CERUMEN (ICD-380.4) Assessment New  Orders: Cerumen Impaction Removal ([address])  Complete Medication List: 1)  Hydrochlorothiazide 25 Mg Tabs (Hydrochlorothiazide) .... 1/2tab once daily 2)  Aspirin Buffered 325 Mg Tabs (Aspirin buff(mgcarb-alaminoac)) .... Once daily 3)  Anusol-hc 2.5 % Crea (Hydrocortisone) .... Apply at bedtime  Patient Instructions: 1)  Please schedule a follow-up appointment as needed.

## 2011-03-29 ENCOUNTER — Other Ambulatory Visit (INDEPENDENT_AMBULATORY_CARE_PROVIDER_SITE_OTHER): Payer: Managed Care, Other (non HMO) | Admitting: Family Medicine

## 2011-03-29 DIAGNOSIS — E785 Hyperlipidemia, unspecified: Secondary | ICD-10-CM

## 2011-03-29 DIAGNOSIS — Z0000 Encounter for general adult medical examination without abnormal findings: Secondary | ICD-10-CM

## 2011-03-29 LAB — BASIC METABOLIC PANEL
BUN: 16 mg/dL (ref 6–23)
CO2: 29 mEq/L (ref 19–32)
Calcium: 8.9 mg/dL (ref 8.4–10.5)
Chloride: 100 mEq/L (ref 96–112)
Creatinine, Ser: 1 mg/dL (ref 0.4–1.5)
GFR: 78.51 mL/min (ref >60.00)
Glucose, Bld: 86 mg/dL (ref 70–99)
Potassium: 4.3 mEq/L (ref 3.5–5.1)
Sodium: 141 mEq/L (ref 135–145)

## 2011-03-29 LAB — CBC WITH DIFFERENTIAL/PLATELET
Basophils Absolute: 0.1 10*3/uL (ref 0.0–0.1)
Basophils Relative: 0.7 % (ref 0.0–3.0)
Eosinophils Absolute: 0.5 10*3/uL (ref 0.0–0.7)
Eosinophils Relative: 6.4 % — ABNORMAL HIGH (ref 0.0–5.0)
HCT: 41.8 % (ref 39.0–52.0)
Hemoglobin: 14.5 g/dL (ref 13.0–17.0)
Lymphocytes Relative: 23.1 % (ref 12.0–46.0)
Lymphs Abs: 2 10*3/uL (ref 0.7–4.0)
MCHC: 34.7 g/dL (ref 30.0–36.0)
MCV: 93.4 fl (ref 78.0–100.0)
Monocytes Absolute: 0.8 10*3/uL (ref 0.1–1.0)
Monocytes Relative: 8.7 % (ref 3.0–12.0)
Neutro Abs: 5.3 10*3/uL (ref 1.4–7.7)
Neutrophils Relative %: 61.1 % (ref 43.0–77.0)
Platelets: 278 10*3/uL (ref 150.0–400.0)
RBC: 4.47 Mil/uL (ref 4.22–5.81)
RDW: 13 % (ref 11.5–14.6)
WBC: 8.6 10*3/uL (ref 4.5–10.5)

## 2011-03-29 LAB — POCT URINALYSIS DIPSTICK
Bilirubin, UA: NEGATIVE
Blood, UA: NEGATIVE
Glucose, UA: NEGATIVE
Leukocytes, UA: NEGATIVE
Nitrite, UA: NEGATIVE
Protein, UA: NEGATIVE
Spec Grav, UA: 1.02
Urobilinogen, UA: 0.2
pH, UA: 7

## 2011-03-29 LAB — LIPID PANEL
Cholesterol: 184 mg/dL (ref 0–200)
HDL: 38.9 mg/dL — ABNORMAL LOW (ref >39.00)
Total CHOL/HDL Ratio: 5
Triglycerides: 400 mg/dL — ABNORMAL HIGH (ref 0.0–149.0)
VLDL: 80 mg/dL — ABNORMAL HIGH (ref 0.0–40.0)

## 2011-03-29 LAB — HEPATIC FUNCTION PANEL
ALT: 22 U/L (ref 0–53)
AST: 21 U/L (ref 0–37)
Albumin: 3.7 g/dL (ref 3.5–5.2)
Alkaline Phosphatase: 79 U/L (ref 39–117)
Bilirubin, Direct: 0 mg/dL (ref 0.0–0.3)
Total Bilirubin: 0.5 mg/dL (ref 0.3–1.2)
Total Protein: 6.8 g/dL (ref 6.0–8.3)

## 2011-03-29 LAB — TSH: TSH: 1.85 u[IU]/mL (ref 0.35–5.50)

## 2011-03-29 LAB — LDL CHOLESTEROL, DIRECT: Direct LDL: 83.1 mg/dL

## 2011-03-29 LAB — PSA: PSA: 1.2 ng/mL (ref 0.10–4.00)

## 2011-04-02 ENCOUNTER — Encounter: Payer: Self-pay | Admitting: Family Medicine

## 2011-04-05 ENCOUNTER — Encounter: Payer: Self-pay | Admitting: Family Medicine

## 2011-04-05 ENCOUNTER — Ambulatory Visit (INDEPENDENT_AMBULATORY_CARE_PROVIDER_SITE_OTHER): Payer: Managed Care, Other (non HMO) | Admitting: Family Medicine

## 2011-04-05 DIAGNOSIS — Z0000 Encounter for general adult medical examination without abnormal findings: Secondary | ICD-10-CM

## 2011-04-05 DIAGNOSIS — I10 Essential (primary) hypertension: Secondary | ICD-10-CM

## 2011-04-05 MED ORDER — HYDROCHLOROTHIAZIDE 25 MG PO TABS: ORAL_TABLET | ORAL | Status: DC

## 2011-04-05 MED ORDER — HYDROCORTISONE 2.5 % RE CREA: 1.0000 "application " | TOPICAL_CREAM | Freq: Every evening | RECTAL | Status: DC | PRN

## 2011-04-05 NOTE — Patient Instructions (Signed)
Continue current medications.  Follow-up 30 minute appointment sometime in the next two to 4 weeks for removal of the two lesions on y  left lower extremity

## 2011-04-05 NOTE — Progress Notes (Signed)
  Subjective:    Patient ID: John Buck, male    DOB: 08-Dec-1955, 55 y.o.   MRN: [id number]  HPI  [name] is a 55 year old, married man, nonsmoker, who comes in today for annual physical examination because of underlying hypertension.  He takes hydrochlorothiazide one daily BP 110/80.  Also an 81-mg baby aspirin.  He states he feels well.  No complaints.  Except he has two lesions on his left lower leg that are irritated.  He would like removed    Review of Systems  Constitutional: Negative.   HENT: Negative.   Eyes: Negative.   Respiratory: Negative.   Cardiovascular: Negative.   Gastrointestinal: Negative.   Genitourinary: Negative.   Musculoskeletal: Negative.   Skin: Negative.   Neurological: Negative.   Hematological: Negative.   Psychiatric/Behavioral: Negative.        Objective:   Physical Exam  Constitutional: He is oriented to person, place, and time. He appears well-developed and well-nourished.  HENT:  Head: Normocephalic and atraumatic.  Right Ear: External ear normal.  Left Ear: External ear normal.  Nose: Nose normal.  Mouth/Throat: Oropharynx is clear and moist.  Eyes: Conjunctivae and EOM are normal. Pupils are equal, round, and reactive to light.  Neck: Normal range of motion. Neck supple. No JVD present. No tracheal deviation present. No thyromegaly present.  Cardiovascular: Normal rate, regular rhythm, normal heart sounds and intact distal pulses.  Exam reveals no gallop and no friction rub.   No murmur heard. Pulmonary/Chest: Effort normal and breath sounds normal. No stridor. No respiratory distress. He has no wheezes. He has no rales. He exhibits no tenderness.  Abdominal: Soft. Bowel sounds are normal. He exhibits no distension and no mass. There is no tenderness. There is no rebound and no guarding.  Genitourinary: Rectum normal, prostate normal and penis normal. Guaiac negative stool. No penile tenderness.  Musculoskeletal: Normal range of motion.  He exhibits no edema and no tenderness.  Lymphadenopathy:    He has no cervical adenopathy.  Neurological: He is alert and oriented to person, place, and time. He has normal reflexes. No cranial nerve deficit. He exhibits normal muscle tone.  Skin: Skin is warm and dry. No rash noted. No erythema. No pallor.       Two lesions, medial side left lower extremity, irritated scaly, and advised to return for removal  Psychiatric: He has a normal mood and affect. His behavior is normal. Judgment and thought content normal.          Assessment & Plan:  Healthy male.  Hypertension.  Continue hydrochlorothiazide 25 mg dose one half tab daily an aspirin tablet.  Two abnormal looking moles.  Left lower extremity returned sometime in the next two to 4 weeks for removal

## 2011-04-23 NOTE — Op Note (Signed)
NAME:  John Buck, John Buck NO.:  [id number]   MEDICAL RECORD NO.:  [id number]          PATIENT TYPE:  AMB   LOCATION:  [facility]                         FACILITY:  [facility]   PHYSICIAN:  Valetta Fuller, M.D.  DATE OF BIRTH:  [birth year]   DATE OF PROCEDURE:  08/03/2006  DATE OF DISCHARGE:                                 OPERATIVE REPORT   PREOPERATIVE DIAGNOSIS:  The right ureteral calculus.   POSTOPERATIVE DIAGNOSIS:  The right ureteral calculus.   PROCEDURE PERFORMED:  Cystoscopy, right retrograde pyelography, right  ureteroscopy, stone basketing, right double-J stent placement (5-French x 24  cm).   SURGEON:  Dr. Isabel Caprice   ANESTHESIA:  General.   INDICATIONS:  Mr. Heard is a 55 year old male with significant history of  nephrolithiasis.  The patient developed significant right flank pain  approximately 3-4 weeks ago and presented to [facility] where he  was diagnosed with a 3-4 mm stone in his right ureteropelvic junction.  There was mild hydronephrosis at that time.  The patient tried to pass the  stone spontaneously and did see Dr. Su Grand approximately 2 weeks ago.  I  saw him recently, it had been now approximately a month since he passed the  stone.  He was continuing to intermittently have significant episodes of  pain with nausea and some increased bladder symptoms.  For that reason he  requested definitive intervention.  We thought that was reasonable.  We fell  ureteroscopy was most appropriate and he appeared to understand the  advantages and disadvantages of this.  The patient had the procedure in the  past and had done well.   DESCRIPTION OF PROCEDURE:  The patient was brought to the operating room  where he had successful induction of general anesthesia.  Placed in  lithotomy position and prepped, draped in the usual manner.  Cystoscopy was  performed which showed unremarkable anterior and prostatic urethra and there  is no evidence of any  bladder pathology.   Retrograde pyelogram was then done with fluoroscopic interpretation by  myself.  An open-end catheter was used and dye was injected.  There was a  little filling defect in the distal ureter approximately 5 cm above the  ureteral vesicle junction with some mild dilation and mild hydronephrosis.   Attention was then towards ureteroscopy.  A guidewire was placed in the  renal pelvis without any difficulty.  Initial attempts to engage in the  distal ureter were unsuccessful.  We did not feel we could go through the  intramural ureter safely due to some tightness in that region.  For that  reason ureteroscope was removed and the inside portion of an access sheath  was used to perform one-step dilation of the ureter.  The ureteroscope was  then easily engaged in the distal ureter.  Approximately 4 mm stone was  encountered which was basket extracted without difficulty.  Over the  guidewire we placed a 5-French 24 cm Polaris stent with fluoroscopic as well  as visual guidance.  A dangle string was left attached and secured to the  patient's  penis.  B&O suppository was given as was lidocaine jelly.  The  patient appeared to tolerate the procedure well.  There are no complications  or difficulties.           ______________________________  Valetta Fuller, M.D.  Electronically Signed     DSG/MEDQ  D:  08/03/2006  T:  08/03/2006  Job:  [id number]

## 2011-05-04 ENCOUNTER — Encounter: Payer: Self-pay | Admitting: Family Medicine

## 2011-05-04 ENCOUNTER — Other Ambulatory Visit: Payer: Self-pay | Admitting: Family Medicine

## 2011-05-04 ENCOUNTER — Ambulatory Visit (INDEPENDENT_AMBULATORY_CARE_PROVIDER_SITE_OTHER): Payer: Managed Care, Other (non HMO) | Admitting: Family Medicine

## 2011-05-04 DIAGNOSIS — L820 Inflamed seborrheic keratosis: Secondary | ICD-10-CM

## 2011-05-04 DIAGNOSIS — H6120 Impacted cerumen, unspecified ear: Secondary | ICD-10-CM

## 2011-05-04 DIAGNOSIS — H9190 Unspecified hearing loss, unspecified ear: Secondary | ICD-10-CM

## 2011-05-04 DIAGNOSIS — L989 Disorder of the skin and subcutaneous tissue, unspecified: Secondary | ICD-10-CM

## 2011-05-04 NOTE — Progress Notes (Signed)
  Subjective:    Patient ID: John Buck, male    DOB: May 26, 1956, 55 y.o.   MRN: [id number]  HPI  John Buck is a delightful, 55 -year-old male, who comes in today for evaluation of bilateral hearing loss from cerumen impactions, and a lesion on his left leg.  That is been irritated and would be removed.  He is been using ear wax dissolving drops at home however, he is not been able to remove ear wax.  The lesion on his left leg as a men's by 8-mm.  This is yellowish with some dark pigmentation and a rough surface.  It one time it went away and now to come back.  The lesion was anesthetized with 1% Xylocaine  with epinephrine.  After informed consent.  It was excised with 2-mm margins.  The base was cauterized and it was applied.  The lesion was sent for pathologic analysis    Review of Systems Negative except for hearing loss as noted above    Objective:   Physical Exam Well-developed well-nourished, male in no acute distress.  Bilateral stone impactions removed by suction and irrigation.  Lesion removed from his left lower extremity.  As noted above       Assessment & Plan:  Bilateral cerumen impactions with hearing loss, removed, Y. Asked for suction irrigation.  Lesion left leg probable S. K.,,,,,,,,, sent for pathologic analysis

## 2011-05-13 NOTE — Progress Notes (Signed)
Quick Note:  Pt informed ______ 

## 2012-02-29 ENCOUNTER — Ambulatory Visit (INDEPENDENT_AMBULATORY_CARE_PROVIDER_SITE_OTHER): Payer: Managed Care, Other (non HMO) | Admitting: Family Medicine

## 2012-02-29 ENCOUNTER — Encounter: Payer: Self-pay | Admitting: Family Medicine

## 2012-02-29 ENCOUNTER — Telehealth: Payer: Self-pay | Admitting: Family Medicine

## 2012-02-29 VITALS — BP 124/84 | Temp 98.2°F | Wt 216.0 lb

## 2012-02-29 DIAGNOSIS — R609 Edema, unspecified: Secondary | ICD-10-CM

## 2012-02-29 DIAGNOSIS — R600 Localized edema: Secondary | ICD-10-CM | POA: Insufficient documentation

## 2012-02-29 MED ORDER — FUROSEMIDE 20 MG PO TABS: 20.0000 mg | ORAL_TABLET | Freq: Every day | ORAL | Status: DC

## 2012-02-29 NOTE — Telephone Encounter (Signed)
Appointment made. Could only come at 10:30

## 2012-02-29 NOTE — Telephone Encounter (Signed)
Pt had leg pains a month ago. Leg pain started again yesterday. Pt has family history of blood clots. Pt would like ov asap.

## 2012-02-29 NOTE — Progress Notes (Signed)
  Subjective:    Patient ID: John Buck, male    DOB: 28-Oct-1956, 56 y.o.   MRN: [id number]  HPI John Buck is a 56 year old male who comes in today for evaluation of swelling of both lower extremities  He's noticed swelling edema and pain of both lower extremities. No history of trauma no sign of infection. He wears very tight ankle socks     Review of Systems General and peripheral vascular review of systems otherwise negative    Objective:   Physical Exam Well-developed well-nourished male in no acute distress examination of lower extremities shows he has socks on with very tight elastic around his ankles and his calves to the knees have 3+ edema. Pulses normal skin normal no evidence of infection       Assessment & Plan:  Peripheral edema plan stop the tight stockings Lasix no salt followup in one week

## 2012-02-29 NOTE — Patient Instructions (Signed)
Stop wearing the tight stockings  Complete no salt diet  Lasix 20 mg daily in the morning  In evening when you're home elevate your legs  Return in one week sooner if any problems

## 2012-03-07 ENCOUNTER — Ambulatory Visit (INDEPENDENT_AMBULATORY_CARE_PROVIDER_SITE_OTHER)
Admission: RE | Admit: 2012-03-07 | Discharge: 2012-03-07 | Disposition: A | Discharge status: Home / Self Care | Payer: Managed Care, Other (non HMO) | Source: Ambulatory Visit | Attending: Family Medicine | Admitting: Family Medicine

## 2012-03-07 ENCOUNTER — Encounter: Payer: Self-pay | Admitting: Family Medicine

## 2012-03-07 ENCOUNTER — Ambulatory Visit (INDEPENDENT_AMBULATORY_CARE_PROVIDER_SITE_OTHER): Payer: Managed Care, Other (non HMO) | Admitting: Family Medicine

## 2012-03-07 VITALS — BP 110/78 | Temp 98.2°F | Wt 212.0 lb

## 2012-03-07 DIAGNOSIS — R609 Edema, unspecified: Secondary | ICD-10-CM

## 2012-03-07 DIAGNOSIS — R2240 Localized swelling, mass and lump, unspecified lower limb: Secondary | ICD-10-CM

## 2012-03-07 DIAGNOSIS — R229 Localized swelling, mass and lump, unspecified: Secondary | ICD-10-CM

## 2012-03-07 DIAGNOSIS — I10 Essential (primary) hypertension: Secondary | ICD-10-CM

## 2012-03-07 LAB — BASIC METABOLIC PANEL
BUN: 26 mg/dL — ABNORMAL HIGH (ref 6–23)
CO2: 31 mEq/L (ref 19–32)
Calcium: 9.3 mg/dL (ref 8.4–10.5)
Chloride: 99 mEq/L (ref 96–112)
Creatinine, Ser: 1.1 mg/dL (ref 0.4–1.5)
GFR: 71.83 mL/min (ref >60.00)
Glucose, Bld: 108 mg/dL — ABNORMAL HIGH (ref 70–99)
Potassium: 3.6 mEq/L (ref 3.5–5.1)
Sodium: 139 mEq/L (ref 135–145)

## 2012-03-07 NOTE — Progress Notes (Signed)
  Subjective:    Patient ID: John Buck, male    DOB: Jan 09, 1956, 56 y.o.   MRN: [id number]  HPI John Buck. is a 56 year old married male nonsmoker who comes back today for followup of peripheral edema  Week saw him a week ago with bilateral peripheral edema and start him on Lasix 20 mg daily he diuresed 4 pounds in a week weight now down from 216-212. He now only has mild swelling in his feet and has persistent pain in the medial portion of his mid lower medial tibia   Review of Systems    general and peripheral review of systems negative Objective:   Physical Exam  Well-developed well-nourished male in no acute distress BP 110/78 1+ edema bilaterally now that the edema has regressed there is a palpable mass medial left lower tibia      Assessment & Plan:  Peripheral edema improved with Lasix check electrolytes  Mass left lower medial tibial area begin with x-ray for diagnostic evaluation

## 2012-03-07 NOTE — Patient Instructions (Signed)
Continue the salt free diet in your medication  Go to the main office now for an x-ray of your leg  I will call you the report tomorrow

## 2012-03-09 ENCOUNTER — Telehealth: Payer: Self-pay | Admitting: Family Medicine

## 2012-03-09 NOTE — Telephone Encounter (Signed)
Pt called req to get xray results. Pls call asap.  °

## 2012-03-09 NOTE — Telephone Encounter (Signed)
Patient is aware 

## 2012-04-03 ENCOUNTER — Other Ambulatory Visit (INDEPENDENT_AMBULATORY_CARE_PROVIDER_SITE_OTHER): Payer: Managed Care, Other (non HMO)

## 2012-04-03 DIAGNOSIS — Z0000 Encounter for general adult medical examination without abnormal findings: Secondary | ICD-10-CM

## 2012-04-03 LAB — HEPATIC FUNCTION PANEL
ALT: 15 U/L (ref 0–53)
AST: 19 U/L (ref 0–37)
Albumin: 3.7 g/dL (ref 3.5–5.2)
Alkaline Phosphatase: 68 U/L (ref 39–117)
Bilirubin, Direct: 0.1 mg/dL (ref 0.0–0.3)
Total Bilirubin: 0.6 mg/dL (ref 0.3–1.2)
Total Protein: 6.9 g/dL (ref 6.0–8.3)

## 2012-04-03 LAB — CBC WITH DIFFERENTIAL/PLATELET
Basophils Absolute: 0 10*3/uL (ref 0.0–0.1)
Basophils Relative: 0.5 % (ref 0.0–3.0)
Eosinophils Absolute: 0.4 10*3/uL (ref 0.0–0.7)
Eosinophils Relative: 5.8 % — ABNORMAL HIGH (ref 0.0–5.0)
HCT: 44.9 % (ref 39.0–52.0)
Hemoglobin: 15.5 g/dL (ref 13.0–17.0)
Lymphocytes Relative: 26 % (ref 12.0–46.0)
Lymphs Abs: 1.9 10*3/uL (ref 0.7–4.0)
MCHC: 34.5 g/dL (ref 30.0–36.0)
MCV: 92.7 fl (ref 78.0–100.0)
Monocytes Absolute: 0.6 10*3/uL (ref 0.1–1.0)
Monocytes Relative: 7.6 % (ref 3.0–12.0)
Neutro Abs: 4.4 10*3/uL (ref 1.4–7.7)
Neutrophils Relative %: 60.1 % (ref 43.0–77.0)
Platelets: 309 10*3/uL (ref 150.0–400.0)
RBC: 4.85 Mil/uL (ref 4.22–5.81)
RDW: 12.6 % (ref 11.5–14.6)
WBC: 7.4 10*3/uL (ref 4.5–10.5)

## 2012-04-03 LAB — LIPID PANEL
Cholesterol: 191 mg/dL (ref 0–200)
HDL: 35.6 mg/dL — ABNORMAL LOW (ref >39.00)
Total CHOL/HDL Ratio: 5
Triglycerides: 217 mg/dL — ABNORMAL HIGH (ref 0.0–149.0)
VLDL: 43.4 mg/dL — ABNORMAL HIGH (ref 0.0–40.0)

## 2012-04-03 LAB — POCT URINALYSIS DIPSTICK
Bilirubin, UA: NEGATIVE
Glucose, UA: NEGATIVE
Ketones, UA: NEGATIVE
Leukocytes, UA: NEGATIVE
Nitrite, UA: NEGATIVE
Protein, UA: NEGATIVE
Spec Grav, UA: 1.025
Urobilinogen, UA: 0.2
pH, UA: 6.5

## 2012-04-03 LAB — BASIC METABOLIC PANEL
BUN: 18 mg/dL (ref 6–23)
CO2: 26 mEq/L (ref 19–32)
Calcium: 9.1 mg/dL (ref 8.4–10.5)
Chloride: 106 mEq/L (ref 96–112)
Creatinine, Ser: 1 mg/dL (ref 0.4–1.5)
GFR: 81.85 mL/min (ref >60.00)
Glucose, Bld: 90 mg/dL (ref 70–99)
Potassium: 4.4 mEq/L (ref 3.5–5.1)
Sodium: 141 mEq/L (ref 135–145)

## 2012-04-03 LAB — PSA: PSA: 2.4 ng/mL (ref 0.10–4.00)

## 2012-04-03 LAB — TSH: TSH: 1.57 u[IU]/mL (ref 0.35–5.50)

## 2012-04-03 LAB — LDL CHOLESTEROL, DIRECT: Direct LDL: 117.4 mg/dL

## 2012-04-10 ENCOUNTER — Encounter: Payer: Self-pay | Admitting: Family Medicine

## 2012-04-10 ENCOUNTER — Ambulatory Visit (INDEPENDENT_AMBULATORY_CARE_PROVIDER_SITE_OTHER): Payer: Managed Care, Other (non HMO) | Admitting: Family Medicine

## 2012-04-10 VITALS — BP 110/80 | Temp 97.9°F | Ht 70.5 in | Wt 207.0 lb

## 2012-04-10 DIAGNOSIS — R609 Edema, unspecified: Secondary | ICD-10-CM

## 2012-04-10 DIAGNOSIS — I10 Essential (primary) hypertension: Secondary | ICD-10-CM

## 2012-04-10 MED ORDER — HYDROCHLOROTHIAZIDE 25 MG PO TABS: ORAL_TABLET | ORAL | Status: DC

## 2012-04-10 MED ORDER — FUROSEMIDE 20 MG PO TABS: 20.0000 mg | ORAL_TABLET | Freq: Every day | ORAL | Status: DC

## 2012-04-10 NOTE — Progress Notes (Signed)
  Subjective:    Patient ID: John Buck, male    DOB: 30-Aug-1956, 56 y.o.   MRN: [id number]  HPI John Buck is a 56 year old married male nonsmoker who comes in today for general physical examination because of a history of hypertension peripheral edema  He takes hydrochlorothiazide 12.5 mg daily and an aspirin tablet and Lasix 20 mg when necessary  He states he feels well blood pressure at home is normal here is 110/80 normal no peripheral edema      Review of Systems  Constitutional: Negative.   HENT: Negative.   Eyes: Negative.   Respiratory: Negative.   Cardiovascular: Negative.   Gastrointestinal: Negative.   Genitourinary: Negative.   Musculoskeletal: Negative.   Skin: Negative.   Neurological: Negative.   Hematological: Negative.   Psychiatric/Behavioral: Negative.        Objective:   Physical Exam  Constitutional: He is oriented to person, place, and time. He appears well-developed and well-nourished.  HENT:  Head: Normocephalic and atraumatic.  Right Ear: External ear normal.  Left Ear: External ear normal.  Nose: Nose normal.  Mouth/Throat: Oropharynx is clear and moist.  Eyes: Conjunctivae and EOM are normal. Pupils are equal, round, and reactive to light.  Neck: Normal range of motion. Neck supple. No JVD present. No tracheal deviation present. No thyromegaly present.  Cardiovascular: Normal rate, regular rhythm, normal heart sounds and intact distal pulses.  Exam reveals no gallop and no friction rub.   No murmur heard. Pulmonary/Chest: Effort normal and breath sounds normal. No stridor. No respiratory distress. He has no wheezes. He has no rales. He exhibits no tenderness.  Abdominal: Soft. Bowel sounds are normal. He exhibits no distension and no mass. There is no tenderness. There is no rebound and no guarding.  Genitourinary: Rectum normal, prostate normal and penis normal. Guaiac negative stool. No penile tenderness.  Musculoskeletal: Normal range of  motion. He exhibits no edema and no tenderness.  Lymphadenopathy:    He has no cervical adenopathy.  Neurological: He is alert and oriented to person, place, and time. He has normal reflexes. No cranial nerve deficit. He exhibits normal muscle tone.  Skin: Skin is warm and dry. No rash noted. No erythema. No pallor.  Psychiatric: He has a normal mood and affect. His behavior is normal. Judgment and thought content normal.          Assessment & Plan:  Healthy male  Hypertension at goal continue current therapy  Peripheral edema at goal continue current therapy

## 2012-04-10 NOTE — Patient Instructions (Signed)
Continue your current treatment program  Followup in one year or sooner if any problems

## 2012-08-08 ENCOUNTER — Other Ambulatory Visit: Payer: Self-pay | Admitting: Family Medicine

## 2012-12-22 ENCOUNTER — Ambulatory Visit: Payer: Managed Care, Other (non HMO) | Admitting: Family Medicine

## 2012-12-25 ENCOUNTER — Ambulatory Visit: Payer: Managed Care, Other (non HMO) | Admitting: Family Medicine

## 2013-03-12 ENCOUNTER — Other Ambulatory Visit: Payer: Managed Care, Other (non HMO)

## 2013-03-19 ENCOUNTER — Encounter: Payer: Managed Care, Other (non HMO) | Admitting: Family Medicine

## 2013-04-13 ENCOUNTER — Encounter: Payer: Self-pay | Admitting: Family Medicine

## 2013-04-13 ENCOUNTER — Ambulatory Visit (INDEPENDENT_AMBULATORY_CARE_PROVIDER_SITE_OTHER): Payer: Managed Care, Other (non HMO) | Admitting: Family Medicine

## 2013-04-13 VITALS — BP 98/68 | Temp 98.3°F

## 2013-04-13 DIAGNOSIS — M546 Pain in thoracic spine: Secondary | ICD-10-CM

## 2013-04-13 MED ORDER — HYDROCODONE-ACETAMINOPHEN 5-325 MG PO TABS: ORAL_TABLET | ORAL | Status: DC

## 2013-04-13 MED ORDER — CYCLOBENZAPRINE HCL 10 MG PO TABS: 10.0000 mg | ORAL_TABLET | Freq: Three times a day (TID) | ORAL | Status: DC | PRN

## 2013-04-13 NOTE — Progress Notes (Signed)
  Subjective:    Patient ID: John Buck, male    DOB: [birth year], 57 y.o.   MRN: [id number]  HPI  Onset LBP 8 days ago lifting furniture. Right lower thoracic area.  Spasm off and on Went to urologist yesterday and x-ray neg for stones. Moderate pain.  Sharp pain. Worse with change of position.   Heat helps.  Advil and Aleve with no relief. Denies fever, chills, productive cough, or any skin rash. No dyspnea  Past Medical History  Diagnosis Date  . Hypertension   . Nephrolithiasis    No past surgical history on file.  reports that he has quit smoking. He does not have any smokeless tobacco history on file. He reports that he does not drink alcohol or use illicit drugs. family history includes Cancer in an unspecified family member; Hypertension in an unspecified family member; and Seizures in an unspecified family member. Allergies  Allergen Reactions  . Butorphanol Tartrate     REACTION: Hallucinations  . Hydromorphone Hcl     REACTION: itching     Review of Systems  Constitutional: Negative for fever, chills and unexpected weight change.  Respiratory: Negative for cough and shortness of breath.   Cardiovascular: Negative for chest pain.  Gastrointestinal: Negative for abdominal pain.  Musculoskeletal: Positive for back pain.  Skin: Negative for rash.       Objective:   Physical Exam  Constitutional: He appears well-developed and well-nourished. No distress.  Cardiovascular: Normal rate and regular rhythm.   Pulmonary/Chest: Effort normal and breath sounds normal. No respiratory distress. He has no wheezes. He has no rales.  Musculoskeletal:  Patient has some nonspecific soft tissue muscular tenderness right mid to lower thoracic region. No spinal tenderness          Assessment & Plan:  Right thoracic back pain. Suspect musculoskeletal. Continue heat. Continue ibuprofen. Flexeril 10 mg each bedtime.  Vicodin 5/325 mg one to 2 every 6 hours as needed for  pain

## 2013-05-01 ENCOUNTER — Other Ambulatory Visit (INDEPENDENT_AMBULATORY_CARE_PROVIDER_SITE_OTHER): Payer: Managed Care, Other (non HMO)

## 2013-05-01 DIAGNOSIS — Z0000 Encounter for general adult medical examination without abnormal findings: Secondary | ICD-10-CM

## 2013-05-01 LAB — LDL CHOLESTEROL, DIRECT: Direct LDL: 130.6 mg/dL

## 2013-05-01 LAB — CBC WITH DIFFERENTIAL/PLATELET
Basophils Absolute: 0 10*3/uL (ref 0.0–0.1)
Basophils Relative: 0.3 % (ref 0.0–3.0)
Eosinophils Absolute: 0.7 10*3/uL (ref 0.0–0.7)
Eosinophils Relative: 8.6 % — ABNORMAL HIGH (ref 0.0–5.0)
HCT: 46.3 % (ref 39.0–52.0)
Hemoglobin: 16.1 g/dL (ref 13.0–17.0)
Lymphocytes Relative: 24.1 % (ref 12.0–46.0)
Lymphs Abs: 2 10*3/uL (ref 0.7–4.0)
MCHC: 34.8 g/dL (ref 30.0–36.0)
MCV: 92.5 fl (ref 78.0–100.0)
Monocytes Absolute: 0.9 10*3/uL (ref 0.1–1.0)
Monocytes Relative: 10.8 % (ref 3.0–12.0)
Neutro Abs: 4.6 10*3/uL (ref 1.4–7.7)
Neutrophils Relative %: 56.2 % (ref 43.0–77.0)
Platelets: 277 10*3/uL (ref 150.0–400.0)
RBC: 5 Mil/uL (ref 4.22–5.81)
RDW: 12.6 % (ref 11.5–14.6)
WBC: 8.3 10*3/uL (ref 4.5–10.5)

## 2013-05-01 LAB — LIPID PANEL
Cholesterol: 210 mg/dL — ABNORMAL HIGH (ref 0–200)
HDL: 40.6 mg/dL (ref >39.00)
Total CHOL/HDL Ratio: 5
Triglycerides: 212 mg/dL — ABNORMAL HIGH (ref 0.0–149.0)
VLDL: 42.4 mg/dL — ABNORMAL HIGH (ref 0.0–40.0)

## 2013-05-01 LAB — POCT URINALYSIS DIPSTICK
Bilirubin, UA: NEGATIVE
Glucose, UA: NEGATIVE
Ketones, UA: NEGATIVE
Leukocytes, UA: NEGATIVE
Nitrite, UA: NEGATIVE
Protein, UA: NEGATIVE
Spec Grav, UA: 1.02
Urobilinogen, UA: 0.2
pH, UA: 6.5

## 2013-05-01 LAB — HEPATIC FUNCTION PANEL
ALT: 20 U/L (ref 0–53)
AST: 21 U/L (ref 0–37)
Albumin: 3.9 g/dL (ref 3.5–5.2)
Alkaline Phosphatase: 74 U/L (ref 39–117)
Bilirubin, Direct: 0.1 mg/dL (ref 0.0–0.3)
Total Bilirubin: 0.7 mg/dL (ref 0.3–1.2)
Total Protein: 7 g/dL (ref 6.0–8.3)

## 2013-05-01 LAB — BASIC METABOLIC PANEL
BUN: 23 mg/dL (ref 6–23)
CO2: 26 mEq/L (ref 19–32)
Calcium: 9.1 mg/dL (ref 8.4–10.5)
Chloride: 103 mEq/L (ref 96–112)
Creatinine, Ser: 1 mg/dL (ref 0.4–1.5)
GFR: 82.49 mL/min (ref >60.00)
Glucose, Bld: 83 mg/dL (ref 70–99)
Potassium: 4.4 mEq/L (ref 3.5–5.1)
Sodium: 137 mEq/L (ref 135–145)

## 2013-05-01 LAB — PSA: PSA: 1.16 ng/mL (ref 0.10–4.00)

## 2013-05-01 LAB — TSH: TSH: 1.39 u[IU]/mL (ref 0.35–5.50)

## 2013-05-07 ENCOUNTER — Encounter: Payer: Self-pay | Admitting: Family Medicine

## 2013-05-07 ENCOUNTER — Ambulatory Visit (INDEPENDENT_AMBULATORY_CARE_PROVIDER_SITE_OTHER): Payer: Managed Care, Other (non HMO) | Admitting: Family Medicine

## 2013-05-07 VITALS — BP 110/80 | Temp 97.7°F | Ht 70.5 in | Wt 217.0 lb

## 2013-05-07 DIAGNOSIS — R609 Edema, unspecified: Secondary | ICD-10-CM

## 2013-05-07 DIAGNOSIS — K644 Residual hemorrhoidal skin tags: Secondary | ICD-10-CM

## 2013-05-07 DIAGNOSIS — I10 Essential (primary) hypertension: Secondary | ICD-10-CM

## 2013-05-07 DIAGNOSIS — H9190 Unspecified hearing loss, unspecified ear: Secondary | ICD-10-CM

## 2013-05-07 MED ORDER — HYDROCORTISONE 2.5 % RE CREA: TOPICAL_CREAM | RECTAL | Status: DC

## 2013-05-07 MED ORDER — HYDROCHLOROTHIAZIDE 25 MG PO TABS: ORAL_TABLET | ORAL | Status: DC

## 2013-05-07 NOTE — Progress Notes (Signed)
  Subjective:    Patient ID: John Buck, male    DOB: Oct 09, 1956, 57 y.o.   MRN: [id number]  HPI John Buck is a 57 year old married male nonsmoker who comes in today for general physical examination  He takes an aspirin tablet daily and hydrochlorothiazide 25 mg daily for venous insufficiency  He also has recurrent hemorrhoids external and uses a ProctoFoam cream when necessary  Routine eye care and normal, dental care really, routine colonoscopy and GI, tetanus 2007 seasonal flu shot every fall   Review of Systems  Constitutional: Negative.   HENT: Negative.   Eyes: Negative.   Respiratory: Negative.   Cardiovascular: Negative.   Gastrointestinal: Negative.   Genitourinary: Negative.   Musculoskeletal: Negative.   Skin: Negative.   Neurological: Negative.   Psychiatric/Behavioral: Negative.        Objective:   Physical Exam  Constitutional: He is oriented to person, place, and time. He appears well-developed and well-nourished.  HENT:  Head: Normocephalic and atraumatic.  Right Ear: External ear normal.  Left Ear: External ear normal.  Nose: Nose normal.  Mouth/Throat: Oropharynx is clear and moist.  Eyes: Conjunctivae and EOM are normal. Pupils are equal, round, and reactive to light.  Neck: Normal range of motion. Neck supple. No JVD present. No tracheal deviation present. No thyromegaly present.  Cardiovascular: Normal rate, regular rhythm, normal heart sounds and intact distal pulses.  Exam reveals no gallop and no friction rub.   No murmur heard. Carotids and aorta normal no bruits, peripheral pulses normal, no edema  Pulmonary/Chest: Effort normal and breath sounds normal. No stridor. No respiratory distress. He has no wheezes. He has no rales. He exhibits no tenderness.  Abdominal: Soft. Bowel sounds are normal. He exhibits no distension and no mass. There is no tenderness. There is no rebound and no guarding.  Genitourinary: Rectum normal, prostate normal and penis  normal. Guaiac negative stool. No penile tenderness.  Musculoskeletal: Normal range of motion. He exhibits no edema and no tenderness.  Lymphadenopathy:    He has no cervical adenopathy.  Neurological: He is alert and oriented to person, place, and time. He has normal reflexes. No cranial nerve deficit. He exhibits normal muscle tone.  Skin: Skin is warm and dry. No rash noted. No erythema. No pallor.  Total body skin exam normal,,,,,,,, freckles moles seborrheic keratosis and skin tags  Psychiatric: He has a normal mood and affect. His behavior is normal. Judgment and thought content normal.          Assessment & Plan:  Healthy male  Peripheral edema continue diuretic daily  Return in one year for general medical exam sooner if any problem  Cerumen impaction right ear home irrigation after 2 weeks of drops

## 2013-05-07 NOTE — Patient Instructions (Addendum)
2 drops of the ear wax dissolving drops in the right ear canal bedtime for 2 weeks and then flush with warm water. If that does not remove ear wax call and make an appointment and we will remove it here  Continue good health habits and exercise  Return in one year sooner if any problems

## 2013-06-29 ENCOUNTER — Other Ambulatory Visit: Payer: Self-pay | Admitting: Family Medicine

## 2013-12-10 ENCOUNTER — Other Ambulatory Visit: Payer: Self-pay | Admitting: Family Medicine

## 2014-01-29 ENCOUNTER — Encounter: Payer: Self-pay | Admitting: Gastroenterology

## 2014-03-12 ENCOUNTER — Other Ambulatory Visit (INDEPENDENT_AMBULATORY_CARE_PROVIDER_SITE_OTHER): Payer: 59

## 2014-03-12 DIAGNOSIS — Z0000 Encounter for general adult medical examination without abnormal findings: Secondary | ICD-10-CM

## 2014-03-12 LAB — CBC WITH DIFFERENTIAL/PLATELET
Basophils Absolute: 0 10*3/uL (ref 0.0–0.1)
Basophils Relative: 0.4 % (ref 0.0–3.0)
Eosinophils Absolute: 0.5 10*3/uL (ref 0.0–0.7)
Eosinophils Relative: 6.6 % — ABNORMAL HIGH (ref 0.0–5.0)
HCT: 44.2 % (ref 39.0–52.0)
Hemoglobin: 15.3 g/dL (ref 13.0–17.0)
Lymphocytes Relative: 21.4 % (ref 12.0–46.0)
Lymphs Abs: 1.8 10*3/uL (ref 0.7–4.0)
MCHC: 34.5 g/dL (ref 30.0–36.0)
MCV: 92.9 fl (ref 78.0–100.0)
Monocytes Absolute: 0.8 10*3/uL (ref 0.1–1.0)
Monocytes Relative: 9.9 % (ref 3.0–12.0)
Neutro Abs: 5.1 10*3/uL (ref 1.4–7.7)
Neutrophils Relative %: 61.7 % (ref 43.0–77.0)
Platelets: 270 10*3/uL (ref 150.0–400.0)
RBC: 4.76 Mil/uL (ref 4.22–5.81)
RDW: 13.3 % (ref 11.5–14.6)
WBC: 8.3 10*3/uL (ref 4.5–10.5)

## 2014-03-12 LAB — POCT URINALYSIS DIPSTICK
Bilirubin, UA: NEGATIVE
Glucose, UA: NEGATIVE
Ketones, UA: NEGATIVE
Leukocytes, UA: NEGATIVE
Nitrite, UA: NEGATIVE
Spec Grav, UA: 1.02
Urobilinogen, UA: 0.2
pH, UA: 7

## 2014-03-12 LAB — LIPID PANEL
Cholesterol: 203 mg/dL — ABNORMAL HIGH (ref 0–200)
HDL: 39.9 mg/dL (ref >39.00)
LDL Cholesterol: 119 mg/dL — ABNORMAL HIGH (ref 0–99)
Total CHOL/HDL Ratio: 5
Triglycerides: 223 mg/dL — ABNORMAL HIGH (ref 0.0–149.0)
VLDL: 44.6 mg/dL — ABNORMAL HIGH (ref 0.0–40.0)

## 2014-03-12 LAB — BASIC METABOLIC PANEL
BUN: 21 mg/dL (ref 6–23)
CO2: 29 mEq/L (ref 19–32)
Calcium: 9.2 mg/dL (ref 8.4–10.5)
Chloride: 104 mEq/L (ref 96–112)
Creatinine, Ser: 0.9 mg/dL (ref 0.4–1.5)
GFR: 98.06 mL/min (ref >60.00)
Glucose, Bld: 84 mg/dL (ref 70–99)
Potassium: 4.3 mEq/L (ref 3.5–5.1)
Sodium: 140 mEq/L (ref 135–145)

## 2014-03-12 LAB — HEPATIC FUNCTION PANEL
ALT: 20 U/L (ref 0–53)
AST: 20 U/L (ref 0–37)
Albumin: 3.8 g/dL (ref 3.5–5.2)
Alkaline Phosphatase: 77 U/L (ref 39–117)
Bilirubin, Direct: 0.1 mg/dL (ref 0.0–0.3)
Total Bilirubin: 1.1 mg/dL (ref 0.3–1.2)
Total Protein: 7.1 g/dL (ref 6.0–8.3)

## 2014-03-12 LAB — PSA: PSA: 1.84 ng/mL (ref 0.10–4.00)

## 2014-03-12 LAB — TSH: TSH: 1.81 u[IU]/mL (ref 0.35–5.50)

## 2014-03-18 ENCOUNTER — Ambulatory Visit (INDEPENDENT_AMBULATORY_CARE_PROVIDER_SITE_OTHER): Payer: 59 | Admitting: Family Medicine

## 2014-03-18 ENCOUNTER — Ambulatory Visit (AMBULATORY_SURGERY_CENTER): Payer: Self-pay | Admitting: *Deleted

## 2014-03-18 ENCOUNTER — Encounter: Payer: Self-pay | Admitting: Family Medicine

## 2014-03-18 VITALS — Ht 70.5 in | Wt 220.6 lb

## 2014-03-18 VITALS — BP 130/90 | Temp 97.9°F | Ht 70.5 in | Wt 220.0 lb

## 2014-03-18 DIAGNOSIS — Z800 Family history of malignant neoplasm of digestive organs: Secondary | ICD-10-CM

## 2014-03-18 DIAGNOSIS — I10 Essential (primary) hypertension: Secondary | ICD-10-CM

## 2014-03-18 DIAGNOSIS — K644 Residual hemorrhoidal skin tags: Secondary | ICD-10-CM

## 2014-03-18 DIAGNOSIS — Z0000 Encounter for general adult medical examination without abnormal findings: Secondary | ICD-10-CM

## 2014-03-18 MED ORDER — HYDROCHLOROTHIAZIDE 12.5 MG PO TABS: 12.5000 mg | ORAL_TABLET | Freq: Every day | ORAL | Status: DC

## 2014-03-18 MED ORDER — HYDROCORTISONE 2.5 % RE CREA: TOPICAL_CREAM | RECTAL | Status: DC

## 2014-03-18 MED ORDER — NA SULFATE-K SULFATE-MG SULF 17.5-3.13-1.6 GM/177ML PO SOLN: ORAL | Status: DC

## 2014-03-18 NOTE — Addendum Note (Signed)
Addended by: Westley Hummer B on: 03/18/2014 09:18 AM   Modules accepted: Orders

## 2014-03-18 NOTE — Patient Instructions (Signed)
Continue your good health habits  Hydrochlorothiazide 12.5 mg.......... one tablet daily in the morning  Call Dr. Herbie Baltimore sypher for evaluation of your hand  Return in one year sooner if any problem

## 2014-03-18 NOTE — Progress Notes (Signed)
Patient denies any allergies to eggs or soy. Patient denies any problems with anesthesia. Patient denies any oxygen use.

## 2014-03-18 NOTE — Progress Notes (Signed)
Pre visit review using our clinic review tool, if applicable. No additional management support is needed unless otherwise documented below in the visit note. 

## 2014-03-18 NOTE — Progress Notes (Signed)
   Subjective:    Patient ID: John Buck, male    DOB: [birth year], 58 y.o.   MRN: [id number]  HPI John Buck is a 58 year old married male nonsmoker who comes in today for general physical exam because of a history of mild hypertension  Uses hydrochlorothiazide 12.5 mg daily BP 130/90  His history of recurrent rectal itching he uses a cortisone cream when necessary  He gets routine eye care, dental care, colonoscopy later on this month, vaccinations up-to-date  Overall he says he feels well the only complaint he has is pain in his left wrist. The sharp pain used to come and go and now it's constant. It tends to shoot up to his elbow.   Review of Systems  Constitutional: Negative.   HENT: Negative.   Eyes: Negative.   Respiratory: Negative.   Cardiovascular: Negative.   Gastrointestinal: Negative.   Genitourinary: Negative.   Musculoskeletal: Negative.   Skin: Negative.   Neurological: Negative.   Psychiatric/Behavioral: Negative.        Objective:   Physical Exam  Nursing note and vitals reviewed. Constitutional: He is oriented to person, place, and time. He appears well-developed and well-nourished.  HENT:  Head: Normocephalic and atraumatic.  Right Ear: External ear normal.  Left Ear: External ear normal.  Nose: Nose normal.  Mouth/Throat: Oropharynx is clear and moist.  Eyes: Conjunctivae and EOM are normal. Pupils are equal, round, and reactive to light.  Neck: Normal range of motion. Neck supple. No JVD present. No tracheal deviation present. No thyromegaly present.  Cardiovascular: Normal rate, regular rhythm, normal heart sounds and intact distal pulses.  Exam reveals no gallop and no friction rub.   No murmur heard. No carotid bruit bruits peripheral pulses 2+ and symmetrical  Pulmonary/Chest: Effort normal and breath sounds normal. No stridor. No respiratory distress. He has no wheezes. He has no rales. He exhibits no tenderness.  Abdominal: Soft. Bowel sounds  are normal. He exhibits no distension and no mass. There is no tenderness. There is no rebound and no guarding.  Genitourinary: Rectum normal, prostate normal and penis normal. Guaiac negative stool. No penile tenderness.  Musculoskeletal: Normal range of motion. He exhibits no edema and no tenderness.  Lymphadenopathy:    He has no cervical adenopathy.  Neurological: He is alert and oriented to person, place, and time. He has normal reflexes. No cranial nerve deficit. He exhibits normal muscle tone.  Skin: Skin is warm and dry. No rash noted. No erythema. No pallor.  Total body skin exam normal scar medial portion left knee from previous knee surgery  Psychiatric: He has a normal mood and affect. His behavior is normal. Judgment and thought content normal.          Assessment & Plan:  Healthy male  Hypertension continue hydrochlorothiazide 12.5 mg daily   recurrent hemorrhoids Anusol cream when necessary  Carpal tunnel syndrome left plan surgical consult with Dr. Suszanne Conners

## 2014-03-19 ENCOUNTER — Telehealth: Payer: Self-pay | Admitting: Family Medicine

## 2014-03-19 NOTE — Telephone Encounter (Signed)
Relevant patient education mailed to patient.  

## 2014-03-21 ENCOUNTER — Encounter: Payer: Self-pay | Admitting: Gastroenterology

## 2014-04-01 ENCOUNTER — Encounter: Payer: Self-pay | Admitting: Gastroenterology

## 2014-04-01 ENCOUNTER — Ambulatory Visit (AMBULATORY_SURGERY_CENTER): Payer: 59 | Admitting: Gastroenterology

## 2014-04-01 VITALS — BP 132/85 | HR 65 | Temp 97.2°F | Resp 20 | Ht 70.0 in | Wt 220.0 lb

## 2014-04-01 DIAGNOSIS — K648 Other hemorrhoids: Secondary | ICD-10-CM

## 2014-04-01 DIAGNOSIS — D126 Benign neoplasm of colon, unspecified: Secondary | ICD-10-CM

## 2014-04-01 DIAGNOSIS — Z800 Family history of malignant neoplasm of digestive organs: Secondary | ICD-10-CM

## 2014-04-01 DIAGNOSIS — Z1211 Encounter for screening for malignant neoplasm of colon: Secondary | ICD-10-CM

## 2014-04-01 MED ORDER — SODIUM CHLORIDE 0.9 % IV SOLN: 500.0000 mL | INTRAVENOUS | Status: DC

## 2014-04-01 NOTE — Progress Notes (Signed)
Called to room to assist during endoscopic procedure.  Patient ID and intended procedure confirmed with present staff. Received instructions for my participation in the procedure from the performing physician.  

## 2014-04-01 NOTE — Patient Instructions (Addendum)
YOU HAD AN ENDOSCOPIC PROCEDURE TODAY AT THE [facility]: Refer to the procedure report that was given to you for any specific questions about what was found during the examination.  If the procedure report does not answer your questions, please call your gastroenterologist to clarify.  If you requested that your care partner not be given the details of your procedure findings, then the procedure report has been included in a sealed envelope for you to review at your convenience later.  YOU SHOULD EXPECT: Some feelings of bloating in the abdomen. Passage of more gas than usual.  Walking can help get rid of the air that was put into your GI tract during the procedure and reduce the bloating. If you had a lower endoscopy (such as a colonoscopy or flexible sigmoidoscopy) you may notice spotting of blood in your stool or on the toilet paper. If you underwent a bowel prep for your procedure, then you may not have a normal bowel movement for a few days.  DIET: Your first meal following the procedure should be a light meal and then it is ok to progress to your normal diet.  A half-sandwich or bowl of soup is an example of a good first meal.  Heavy or fried foods are harder to digest and may make you feel nauseous or bloated.  Likewise meals heavy in dairy and vegetables can cause extra gas to form and this can also increase the bloating.  Drink plenty of fluids but you should avoid alcoholic beverages for 24 hours.  ACTIVITY: Your care partner should take you home directly after the procedure.  You should plan to take it easy, moving slowly for the rest of the day.  You can resume normal activity the day after the procedure however you should NOT DRIVE or use heavy machinery for 24 hours (because of the sedation medicines used during the test).    SYMPTOMS TO REPORT IMMEDIATELY: A gastroenterologist can be reached at any hour.  During normal business hours, 8:30 AM to 5:00 PM Monday through Friday,  call [phone].  After hours and on weekends, please call the GI answering service at [phone] who will take a message and have the physician on call contact you.   Following lower endoscopy (colonoscopy or flexible sigmoidoscopy):  Excessive amounts of blood in the stool  Significant tenderness or worsening of abdominal pains  Swelling of the abdomen that is new, acute  Fever of 100F or higher   FOLLOW UP: If any biopsies were taken you will be contacted by phone or by letter within the next 1-3 weeks.  Call your gastroenterologist if you have not heard about the biopsies in 3 weeks.  Our staff will call the home number listed on your records the next business day following your procedure to check on you and address any questions or concerns that you may have at that time regarding the information given to you following your procedure. This is a courtesy call and so if there is no answer at the home number and we have not heard from you through the emergency physician on call, we will assume that you have returned to your regular daily activities without incident.  SIGNATURES/CONFIDENTIALITY: You and/or your care partner have signed paperwork which will be entered into your electronic medical record.  These signatures attest to the fact that that the information above on your After Visit Summary has been reviewed and is understood.  Full responsibility of the confidentiality of   this discharge information lies with you and/or your care-partner.    Handouts were given to your care partner on polyps, hemorrhoids and a high fiber diet with liberal fluid intake. A pamphlet was also given on the Trion hemorrhoid treatment procedure. You may resume your current medications today. Await biopsy results. Please call if any questions or concerns. Please call Robbin, CMA for Dr. Deatra Ina on the 3rd floor if interested in Hemorroid procedure.  [phone]

## 2014-04-01 NOTE — Op Note (Signed)
[place]  [address] [place], [id number]   COLONOSCOPY PROCEDURE REPORT  PATIENT: John Buck, John Buck  MR#: [id number] BIRTHDATE: 1956-09-20 , 47  yrs. old GENDER: Male ENDOSCOPIST: Inda Castle, MD REFERRED BY: PROCEDURE DATE:  04/01/2014 PROCEDURE:   Colonoscopy with snare polypectomy First Screening Colonoscopy - Avg.  risk and is 50 yrs.  old or older - No.  Prior Negative Screening - Now for repeat screening. Above average risk  History of Adenoma - Now for follow-up colonoscopy & has been > or = to 3 yrs.  N/A  Polyps Removed Today? Yes. ASA CLASS:   Class II INDICATIONS:Patient's immediate family history of colon cancer. MEDICATIONS: MAC sedation, administered by CRNA and Propofol (Diprivan) 240 mg IV  DESCRIPTION OF PROCEDURE:   After the risks benefits and alternatives of the procedure were thoroughly explained, informed consent was obtained.  A digital rectal exam revealed no abnormalities of the rectum.   The [id number]  endoscope was introduced through the anus and advanced to the cecum, which was identified by both the appendix and ileocecal valve. No adverse events experienced.   The quality of the prep was Suprep good  The instrument was then slowly withdrawn as the colon was fully examined.      COLON FINDINGS: A flat polyp measuring 3 mm in size was found at the cecum.  A polypectomy was performed with a cold snare.  The resection was complete and the polyp tissue was completely retrieved.   Internal hemorrhoids were found.   The colon was otherwise normal.  There was no diverticulosis, inflammation, polyps or cancers unless previously stated.  Retroflexed views revealed no abnormalities. The time to cecum=3 minutes 03 seconds. Withdrawal time=9 minutes 21 seconds.  The scope was withdrawn and the procedure completed. COMPLICATIONS: There were no complications.  ENDOSCOPIC IMPRESSION: 1.   Flat polyp measuring 3 mm in  size was found at the cecum; polypectomy was performed with a cold snare 2.   Internal hemorrhoids 3.   The colon was otherwise normal  RECOMMENDATIONS: Given your significant family history of colon cancer, you should have a repeat colonoscopy in 5 years   eSigned:  Inda Castle, MD 04/01/2014 9:24 AM   cc: Dorena Cookey, MD and Patria Mane MD   PATIENT NAME:  John Buck, John Buck MR#: [id number]

## 2014-04-01 NOTE — Progress Notes (Signed)
Procedure ends, to recovery, report given and VSS. 

## 2014-04-01 NOTE — Progress Notes (Signed)
No complaints noted in the recovery room. Maw   

## 2014-04-02 ENCOUNTER — Telehealth: Payer: Self-pay | Admitting: *Deleted

## 2014-04-02 NOTE — Telephone Encounter (Signed)
  Follow up Call-  Call back number 04/01/2014  Post procedure Call Back phone  # [phone]  Permission to leave phone message Yes     Patient questions:  Do you have a fever, pain , or abdominal swelling? no Pain Score  0 *  Have you tolerated food without any problems? yes  Have you been able to return to your normal activities? yes  Do you have any questions about your discharge instructions: Diet   no Medications  no Follow up visit  no  Do you have questions or concerns about your Care? no  Actions: * If pain score is 4 or above: No action needed, pain <4.

## 2014-04-05 ENCOUNTER — Encounter: Payer: Self-pay | Admitting: Gastroenterology

## 2015-05-30 ENCOUNTER — Other Ambulatory Visit: Payer: Self-pay | Admitting: Family Medicine

## 2015-08-26 ENCOUNTER — Other Ambulatory Visit: Payer: Self-pay | Admitting: Family Medicine

## 2015-11-24 ENCOUNTER — Other Ambulatory Visit: Payer: Self-pay | Admitting: Family Medicine

## 2016-01-22 ENCOUNTER — Encounter: Payer: Self-pay | Admitting: Gastroenterology

## 2016-02-22 ENCOUNTER — Other Ambulatory Visit: Payer: Self-pay | Admitting: Family Medicine

## 2016-03-02 ENCOUNTER — Other Ambulatory Visit: Payer: Self-pay | Admitting: Family Medicine

## 2016-03-21 ENCOUNTER — Other Ambulatory Visit: Payer: Self-pay | Admitting: Family Medicine

## 2016-10-12 ENCOUNTER — Ambulatory Visit: Payer: Self-pay | Admitting: Orthopedic Surgery

## 2016-12-09 ENCOUNTER — Other Ambulatory Visit: Payer: Self-pay | Admitting: Orthopedic Surgery

## 2016-12-09 ENCOUNTER — Ambulatory Visit: Payer: Self-pay | Admitting: Orthopedic Surgery

## 2016-12-09 NOTE — H&P (Signed)
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 The patient is scheduled for a right total knee arthroplasty to be performed by Dr. Dione Plover. Aluisio, MD at [facility] on 12-27-2016. The patient is a 61 year old male who presented with knee complaints. The patient reports right knee symptoms including: pain, swelling, stiffness and limping which began year(s) ago without any known injury. The patient describes their pain as aching.The patient feels that the symptoms are worsening. Past treatment for this problem has included intra-articular injection of corticosteroids (Dr. Onnie Graham gave pt. cortisone injection years ago: helped some). Symptoms are reported to be located in the right knee and right anterior knee and include knee pain, anterior knee pain, swelling and stiffness. The patient does not report any radiation of symptoms. Symptoms are exacerbated by motion at the knee. Symptoms are relieved by rest. Current treatment includes nonsteroidal anti-inflammatory drugs (Ibuprofen prn). Right knee is getting progressively worse over time. He has had multiple injections, no longer any benefit. At this point, the knee has substantially taken over his life. It is limiting what he can and cannot do. He has some discomfort in left, but nowhere near as bad as the right. It does feel like it wants to give out at times. He is now at a point where he would like to get the knee fixed. They have been treated conservatively in the past for the above stated problem and despite conservative measures, they continue to have progressive pain and severe functional limitations and dysfunction. They have failed non-operative management including home exercise, medications, and injections. It is felt that they would benefit  from undergoing total joint replacement. Risks and benefits of the procedure have been discussed with the patient and they elect to proceed with surgery. There are no active contraindications to surgery such as ongoing infection or rapidly progressive neurological disease.   Problem List/Past Medical  Right knee pain (M25.561)  Primary osteoarthritis of right knee (M17.11)  Irritable bowel syndrome  Kidney Stone  Diverticulosis  Hemorrhoids  Measles  Mumps   Allergies  STADOL [11/13/2008]: Hallucinations DILAUDID [11/13/2008]: Itching.  Family History  Hypertension  Brother, Mother.  Social History  Children  3 Current work status  working full time Exercise  Exercises rarely Living situation  live with spouse Marital status  married Never consumed alcohol  08/01/2014: Never consumed alcohol No history of drug/alcohol rehab  Not under pain contract  Number of flights of stairs before winded  2-3 Tobacco / smoke exposure  08/01/2014: no Tobacco use  Never smoker. 08/01/2014 Advance Directives  Living Will, Healthcare POA  Medication History Aspirin (325MG  Tablet, 1 (one) Oral) Active. Glucosamine (500MG  Capsule, Oral) Active. Probiotic Daily (Oral) Active. Ibuprofen (prn) Active. Hydrochlorothiazide (12.5MG  Capsule, Oral) Active.  Past Surgical History  Arthroscopy of Knee  right Colon Polyp Removal - Colonoscopy  Vasectomy   Review of Systems  General Not Present- Chills, Fatigue, Fever, Memory Loss, Night Sweats, Weight Gain and Weight Loss. Skin Not Present- Eczema, Hives, Itching, Lesions and Rash. HEENT Not Present- Dentures, Double Vision, Headache, Hearing Loss, Tinnitus and Visual Loss. Respiratory Not Present- Allergies, Chronic Cough, Coughing up blood, Shortness of breath at rest and Shortness of breath with exertion. Cardiovascular Not Present- Chest Pain, Difficulty Breathing Lying Down,  Murmur, Palpitations,  Racing/skipping heartbeats and Swelling. Gastrointestinal Not Present- Abdominal Pain, Bloody Stool, Constipation, Diarrhea, Difficulty Swallowing, Heartburn, Jaundice, Loss of appetitie, Nausea and Vomiting. Male Genitourinary Not Present- Blood in Urine, Discharge, Flank Pain, Incontinence, Painful Urination, Urgency, Urinary frequency, Urinary Retention, Urinating at Night and Weak urinary stream. Musculoskeletal Present- Joint Pain. Not Present- Back Pain, Joint Swelling, Morning Stiffness, Muscle Pain, Muscle Weakness and Spasms. Neurological Not Present- Blackout spells, Difficulty with balance, Dizziness, Paralysis, Tremor and Weakness. Psychiatric Not Present- Insomnia.  Vitals Weight: 221 lb Height: 70in Body Surface Area: 2.18 m Body Mass Index: 31.71 kg/m  Pulse: 76 (Regular)  BP: 132/82 (Sitting, Right Arm, Standard)   Physical Exam General Mental Status -Alert, cooperative and good historian. General Appearance-pleasant, Not in acute distress. Orientation-Oriented X3. Build & Nutrition-Well nourished and Well developed.  Head and Neck Head-normocephalic, atraumatic . Neck Global Assessment - supple, no bruit auscultated on the right, no bruit auscultated on the left.  Eye Pupil - Bilateral-Regular and Round. Motion - Bilateral-EOMI.  Chest and Lung Exam Auscultation Breath sounds - clear at anterior chest wall and clear at posterior chest wall. Adventitious sounds - No Adventitious sounds.  Cardiovascular Auscultation Rhythm - Regular rate and rhythm. Heart Sounds - S1 WNL and S2 WNL. Murmurs & Other Heart Sounds - Auscultation of the heart reveals - No Murmurs.  Abdomen Palpation/Percussion Tenderness - Abdomen is non-tender to palpation. Rigidity (guarding) - Abdomen is soft. Auscultation Auscultation of the abdomen reveals - Bowel sounds normal.  Male Genitourinary Note: Not done, not pertinent to present  illness  Musculoskeletal Note: On exam, a well-developed male, in no distress. Evaluation of his hips show normal range of motion with no discomfort. Evaluation of the left knee, no effusion. Range of motion is 0 to 135 with no tenderness or instability. Right knee, slight varus, range is 5 to 125, marked crepitus on range of motion with tenderness medial greater than lateral with no instability noted. Pulse, sensation and motor are intact both lower extremities. He has got a significantly antalgic gait pattern on the right.  RADIOGRAPHS AP both knees and lateral show bone on bone arthritis medial patellofemoral compartments of the right knee. He has some arthritic change in left, but not as bad.  Assessment & Plan Primary osteoarthritis of right knee (M17.11)  Note:Surgical Plans: Right Total Knee Replacement  Disposition: Home  PCP: Dr. Moreen Fowler - Patient has been seen preoperatively and felt to be stable for surgery.  IV Topical TXA  Anesthesia Issues: None  Signed electronically by Ok Edwards, III PA-C

## 2016-12-16 ENCOUNTER — Other Ambulatory Visit (HOSPITAL_COMMUNITY): Payer: Self-pay | Admitting: *Deleted

## 2016-12-16 NOTE — Patient Instructions (Addendum)
John Buck  12/16/2016   Your procedure is scheduled on: 12-27-16  Report to [name] Main  Entrance take [facility]  elevators to 3rd floor to  [place] at 1045 AM.  Call this number if you have problems the morning of surgery [phone]   Remember: ONLY 1 PERSON MAY GO WITH YOU TO SHORT STAY TO GET  READY MORNING OF [place].  Do not eat food :After Midnight Sunday night , clar liquids from  midnight until 745 am day of surgery, nothing by mouth after 745 am day of surgery..     Take these medicines the morning of surgery with A SIP OF WATER: Tylenol as needed                               You may not have any metal on your body including hair pins and              piercings  Do not wear jewelry, make-up, lotions, powders or perfumes, deodorant             Do not wear nail polish.  Do not shave  48 hours prior to surgery.              Men may shave face and neck.   Do not bring valuables to the hospital. [name].  Contacts, dentures or bridgework may not be worn into surgery.  Leave suitcase in the car. After surgery it may be brought to your room.                  Please read over the following fact sheets you were given: _____________________________________________________________________                CLEAR LIQUID DIET   Foods Allowed                                                                     Foods Excluded  Coffee and tea, regular and decaf                             liquids that you cannot  Plain Jell-O in any flavor                                             see through such as: Fruit ices (not with fruit pulp)                                     milk, soups, orange juice  Iced Popsicles  All solid food Carbonated beverages, regular and diet                                    Cranberry, grape and apple juices Sports drinks like  Gatorade Lightly seasoned clear broth or consume(fat free) Sugar, honey syrup  Sample Menu Breakfast                                Lunch                                     Supper Cranberry juice                    Beef broth                            Chicken broth Jell-O                                     Grape juice                           Apple juice Coffee or tea                        Jell-O                                      Popsicle                                                Coffee or tea                        Coffee or tea  _____________________________________________________________________  [name] - Preparing for Surgery Before surgery, you can play an important role.  Because skin is not sterile, your skin needs to be as free of germs as possible.  You can reduce the number of germs on your skin by washing with CHG (chlorahexidine gluconate) soap before surgery.  CHG is an antiseptic cleaner which kills germs and bonds with the skin to continue killing germs even after washing. Please DO NOT use if you have an allergy to CHG or antibacterial soaps.  If your skin becomes reddened/irritated stop using the CHG and inform your nurse when you arrive at Short Stay. Do not shave (including legs and underarms) for at least 48 hours prior to the first CHG shower.  You may shave your face/neck. Please follow these instructions carefully:  1.  Shower with CHG Soap the night before surgery and the  morning of Surgery.  2.  If you choose to wash your hair, wash your hair first as usual with your  normal  shampoo.  3.  After you shampoo, rinse your hair and body thoroughly to remove the  shampoo.  4.  Use CHG as you would any other liquid soap.  You can apply chg directly  to the skin and wash                       Gently with a scrungie or clean washcloth.  5.  Apply the CHG Soap to your body ONLY FROM THE NECK DOWN.   Do not use on face/ open                            Wound or open sores. Avoid contact with eyes, ears mouth and genitals (private parts).                       Wash face,  Genitals (private parts) with your normal soap.             6.  Wash thoroughly, paying special attention to the area where your surgery  will be performed.  7.  Thoroughly rinse your body with warm water from the neck down.  8.  DO NOT shower/wash with your normal soap after using and rinsing off  the CHG Soap.                9.  Pat yourself dry with a clean towel.            10.  Wear clean pajamas.            11.  Place clean sheets on your bed the night of your first shower and do not  sleep with pets. Day of Surgery : Do not apply any lotions/deodorants the morning of surgery.  Please wear clean clothes to the hospital/surgery center.  FAILURE TO FOLLOW THESE INSTRUCTIONS MAY RESULT IN THE CANCELLATION OF YOUR SURGERY PATIENT SIGNATURE_________________________________  NURSE SIGNATURE__________________________________  ________________________________________________________________________   John Buck  An incentive spirometer is a tool that can help keep your lungs clear and active. This tool measures how well you are filling your lungs with each breath. Taking long deep breaths may help reverse or decrease the chance of developing breathing (pulmonary) problems (especially infection) following:  A long period of time when you are unable to move or be active. BEFORE THE PROCEDURE   If the spirometer includes an indicator to show your best effort, your nurse or respiratory therapist will set it to a desired goal.  If possible, sit up straight or lean slightly forward. Try not to slouch.  Hold the incentive spirometer in an upright position. INSTRUCTIONS FOR USE  1. Sit on the edge of your bed if possible, or sit up as far as you can in bed or on a chair. 2. Hold the incentive spirometer in an upright position. 3. Breathe out  normally. 4. Place the mouthpiece in your mouth and seal your lips tightly around it. 5. Breathe in slowly and as deeply as possible, raising the piston or the ball toward the top of the column. 6. Hold your breath for 3-5 seconds or for as long as possible. Allow the piston or ball to fall to the bottom of the column. 7. Remove the mouthpiece from your mouth and breathe out normally. 8. Rest for a few seconds and repeat Steps 1 through 7 at least 10 times every 1-2 hours when you are awake. Take your time and take a few normal breaths between deep breaths. 9. The spirometer may include an indicator to  show your best effort. Use the indicator as a goal to work toward during each repetition. 10. After each set of 10 deep breaths, practice coughing to be sure your lungs are clear. If you have an incision (the cut made at the time of surgery), support your incision when coughing by placing a pillow or rolled up towels firmly against it. Once you are able to get out of bed, walk around indoors and cough well. You may stop using the incentive spirometer when instructed by your caregiver.  RISKS AND COMPLICATIONS  Take your time so you do not get dizzy or light-headed.  If you are in pain, you may need to take or ask for pain medication before doing incentive spirometry. It is harder to take a deep breath if you are having pain. AFTER USE  Rest and breathe slowly and easily.  It can be helpful to keep track of a log of your progress. Your caregiver can provide you with a simple table to help with this. If you are using the spirometer at home, follow these instructions: [place] IF:   You are having difficultly using the spirometer.  You have trouble using the spirometer as often as instructed.  Your pain medication is not giving enough relief while using the spirometer.  You develop fever of 100.5 F (38.1 C) or higher. SEEK IMMEDIATE MEDICAL CARE IF:   You cough up bloody sputum  that had not been present before.  You develop fever of 102 F (38.9 C) or greater.  You develop worsening pain at or near the incision site. MAKE SURE YOU:   Understand these instructions.  Will watch your condition.  Will get help right away if you are not doing well or get worse. Document Released: 04/04/2007 Document Revised: 02/14/2012 Document Reviewed: 06/05/2007 ExitCare Patient Information 2014 ExitCare, [place].   ________________________________________________________________________  WHAT IS A BLOOD TRANSFUSION? Blood Transfusion Information  A transfusion is the replacement of blood or some of its parts. Blood is made up of multiple cells which provide different functions.  Red blood cells carry oxygen and are used for blood loss replacement.  White blood cells fight against infection.  Platelets control bleeding.  Plasma helps clot blood.  Other blood products are available for specialized needs, such as hemophilia or other clotting disorders. BEFORE THE TRANSFUSION  Who gives blood for transfusions?   Healthy volunteers who are fully evaluated to make sure their blood is safe. This is blood bank blood. Transfusion therapy is the safest it has ever been in the practice of medicine. Before blood is taken from a donor, a complete history is taken to make sure that person has no history of diseases nor engages in risky social behavior (examples are intravenous drug use or sexual activity with multiple partners). The donor's travel history is screened to minimize risk of transmitting infections, such as malaria. The donated blood is tested for signs of infectious diseases, such as HIV and hepatitis. The blood is then tested to be sure it is compatible with you in order to minimize the chance of a transfusion reaction. If you or a relative donates blood, this is often done in anticipation of surgery and is not appropriate for emergency situations. It takes many days to  process the donated blood. RISKS AND COMPLICATIONS Although transfusion therapy is very safe and saves many lives, the main dangers of transfusion include:   Getting an infectious disease.  Developing a transfusion reaction. This is an allergic reaction  to something in the blood you were given. Every precaution is taken to prevent this. The decision to have a blood transfusion has been considered carefully by your caregiver before blood is given. Blood is not given unless the benefits outweigh the risks. AFTER THE TRANSFUSION  Right after receiving a blood transfusion, you will usually feel much better and more energetic. This is especially true if your red blood cells have gotten low (anemic). The transfusion raises the level of the red blood cells which carry oxygen, and this usually causes an energy increase.  The nurse administering the transfusion will monitor you carefully for complications. HOME CARE INSTRUCTIONS  No special instructions are needed after a transfusion. You may find your energy is better. Speak with your caregiver about any limitations on activity for underlying diseases you may have. SEEK MEDICAL CARE IF:   Your condition is not improving after your transfusion.  You develop redness or irritation at the intravenous (IV) site. SEEK IMMEDIATE MEDICAL CARE IF:  Any of the following symptoms occur over the next 12 hours:  Shaking chills.  You have a temperature by mouth above 102 F (38.9 C), not controlled by medicine.  Chest, back, or muscle pain.  People around you feel you are not acting correctly or are confused.  Shortness of breath or difficulty breathing.  Dizziness and fainting.  You get a rash or develop hives.  You have a decrease in urine output.  Your urine turns a dark color or changes to pink, red, or brown. Any of the following symptoms occur over the next 10 days:  You have a temperature by mouth above 102 F (38.9 C), not controlled by  medicine.  Shortness of breath.  Weakness after normal activity.  The white part of the eye turns yellow (jaundice).  You have a decrease in the amount of urine or are urinating less often.  Your urine turns a dark color or changes to pink, red, or brown. Document Released: 11/19/2000 Document Revised: 02/14/2012 Document Reviewed: 07/08/2008 [facility] Patient Information 2014 [place], [place].  _______________________________________________________________________

## 2016-12-20 ENCOUNTER — Encounter (HOSPITAL_COMMUNITY): Payer: Self-pay

## 2016-12-20 ENCOUNTER — Encounter (HOSPITAL_COMMUNITY)
Admission: RE | Admit: 2016-12-20 | Discharge: 2016-12-20 | Disposition: A | Discharge status: Home / Self Care | Payer: 59 | Source: Ambulatory Visit | Attending: Orthopedic Surgery | Admitting: Orthopedic Surgery

## 2016-12-20 ENCOUNTER — Encounter (INDEPENDENT_AMBULATORY_CARE_PROVIDER_SITE_OTHER): Payer: Self-pay

## 2016-12-20 DIAGNOSIS — Z01812 Encounter for preprocedural laboratory examination: Secondary | ICD-10-CM | POA: Diagnosis not present

## 2016-12-20 DIAGNOSIS — Z01810 Encounter for preprocedural cardiovascular examination: Secondary | ICD-10-CM | POA: Diagnosis not present

## 2016-12-20 DIAGNOSIS — M1711 Unilateral primary osteoarthritis, right knee: Secondary | ICD-10-CM | POA: Insufficient documentation

## 2016-12-20 HISTORY — DX: Personal history of urinary calculi: Z87.442

## 2016-12-20 HISTORY — DX: Unspecified osteoarthritis, unspecified site: M19.90

## 2016-12-20 LAB — COMPREHENSIVE METABOLIC PANEL
ALT: 18 U/L (ref 17–63)
AST: 21 U/L (ref 15–41)
Albumin: 4.2 g/dL (ref 3.5–5.0)
Alkaline Phosphatase: 72 U/L (ref 38–126)
Anion gap: 6 (ref 5–15)
BUN: 17 mg/dL (ref 6–20)
CO2: 27 mmol/L (ref 22–32)
Calcium: 9.1 mg/dL (ref 8.9–10.3)
Chloride: 106 mmol/L (ref 101–111)
Creatinine, Ser: 0.89 mg/dL (ref 0.61–1.24)
GFR calc Af Amer: >60 mL/min (ref >60)
GFR calc non Af Amer: >60 mL/min (ref >60)
Glucose, Bld: 83 mg/dL (ref 65–99)
Potassium: 4.8 mmol/L (ref 3.5–5.1)
Sodium: 139 mmol/L (ref 135–145)
Total Bilirubin: 0.5 mg/dL (ref 0.3–1.2)
Total Protein: 7.6 g/dL (ref 6.5–8.1)

## 2016-12-20 LAB — URINALYSIS, ROUTINE W REFLEX MICROSCOPIC
Bilirubin Urine: NEGATIVE
Glucose, UA: NEGATIVE mg/dL
Hgb urine dipstick: NEGATIVE
Ketones, ur: NEGATIVE mg/dL
Leukocytes, UA: NEGATIVE
Nitrite: NEGATIVE
Protein, ur: NEGATIVE mg/dL
Specific Gravity, Urine: 1.02 (ref 1.005–1.030)
pH: 6 (ref 5.0–8.0)

## 2016-12-20 LAB — CBC
HCT: 43.5 % (ref 39.0–52.0)
Hemoglobin: 15.4 g/dL (ref 13.0–17.0)
MCH: 32 pg (ref 26.0–34.0)
MCHC: 35.4 g/dL (ref 30.0–36.0)
MCV: 90.2 fL (ref 78.0–100.0)
Platelets: 274 10*3/uL (ref 150–400)
RBC: 4.82 MIL/uL (ref 4.22–5.81)
RDW: 12.5 % (ref 11.5–15.5)
WBC: 9.2 10*3/uL (ref 4.0–10.5)

## 2016-12-20 LAB — SURGICAL PCR SCREEN
MRSA, PCR: NEGATIVE
Staphylococcus aureus: POSITIVE — AB

## 2016-12-20 LAB — ABO/RH: ABO/RH(D): A NEG

## 2016-12-20 LAB — APTT: aPTT: 31 seconds (ref 24–36)

## 2016-12-20 LAB — PROTIME-INR
INR: 1.02
Prothrombin Time: 13.4 seconds (ref 11.4–15.2)

## 2016-12-20 NOTE — Progress Notes (Signed)
   12/20/16 1026  OBSTRUCTIVE SLEEP APNEA  Have you ever been diagnosed with sleep apnea through a sleep study? No  Do you snore loudly (loud enough to be heard through closed doors)?  1  Do you often feel tired, fatigued, or sleepy during the daytime (such as falling asleep during driving or talking to someone)? 1  Has anyone observed you stop breathing during your sleep? 1  Do you have, or are you being treated for high blood pressure? 1  Age > 19 (1-yes) 1  Male Gender (Yes=1) 1  Obstructive Sleep Apnea Score 6

## 2016-12-20 NOTE — Progress Notes (Signed)
Medical clearance 03/29/16 per Dr Moreen Fowler

## 2016-12-27 ENCOUNTER — Inpatient Hospital Stay (HOSPITAL_COMMUNITY): Payer: 59 | Admitting: Certified Registered Nurse Anesthetist

## 2016-12-27 ENCOUNTER — Encounter (HOSPITAL_COMMUNITY): Payer: Self-pay | Admitting: *Deleted

## 2016-12-27 ENCOUNTER — Encounter (HOSPITAL_COMMUNITY)
Admission: RE | Disposition: A | Discharge status: Home / Self Care | Payer: Self-pay | Source: Ambulatory Visit | Attending: Orthopedic Surgery

## 2016-12-27 ENCOUNTER — Inpatient Hospital Stay (HOSPITAL_COMMUNITY)
Admission: RE | Admit: 2016-12-27 | Discharge: 2016-12-29 | DRG: 470 | Disposition: A | Discharge status: Home / Self Care | Payer: 59 | Source: Ambulatory Visit | Attending: Orthopedic Surgery | Admitting: Orthopedic Surgery

## 2016-12-27 DIAGNOSIS — Z87442 Personal history of urinary calculi: Secondary | ICD-10-CM

## 2016-12-27 DIAGNOSIS — M1711 Unilateral primary osteoarthritis, right knee: Principal | ICD-10-CM | POA: Diagnosis present

## 2016-12-27 DIAGNOSIS — N200 Calculus of kidney: Secondary | ICD-10-CM | POA: Diagnosis not present

## 2016-12-27 DIAGNOSIS — I10 Essential (primary) hypertension: Secondary | ICD-10-CM | POA: Diagnosis not present

## 2016-12-27 DIAGNOSIS — M1710 Unilateral primary osteoarthritis, unspecified knee: Secondary | ICD-10-CM | POA: Diagnosis present

## 2016-12-27 DIAGNOSIS — Z96651 Presence of right artificial knee joint: Secondary | ICD-10-CM

## 2016-12-27 DIAGNOSIS — M179 Osteoarthritis of knee, unspecified: Secondary | ICD-10-CM

## 2016-12-27 DIAGNOSIS — G8918 Other acute postprocedural pain: Secondary | ICD-10-CM | POA: Diagnosis not present

## 2016-12-27 HISTORY — PX: TOTAL KNEE ARTHROPLASTY: SHX125

## 2016-12-27 LAB — TYPE AND SCREEN
ABO/RH(D): A NEG
Antibody Screen: NEGATIVE

## 2016-12-27 SURGERY — ARTHROPLASTY, KNEE, TOTAL
Anesthesia: Spinal | Site: Knee | Laterality: Right

## 2016-12-27 MED ORDER — DEXTROSE 5 % IV SOLN
500.0000 mg | Freq: Four times a day (QID) | INTRAVENOUS | Status: DC | PRN
  Administered 2016-12-27: 500 mg via INTRAVENOUS
  Filled 2016-12-27: qty 5
  Filled 2016-12-27: qty 550

## 2016-12-27 MED ORDER — MIDAZOLAM HCL 2 MG/2ML IJ SOLN
INTRAMUSCULAR | Status: AC
  Filled 2016-12-27: qty 2

## 2016-12-27 MED ORDER — TRANEXAMIC ACID 1000 MG/10ML IV SOLN
1000.0000 mg | INTRAVENOUS | Status: AC
  Administered 2016-12-27: 1000 mg via INTRAVENOUS
  Filled 2016-12-27: qty 1100

## 2016-12-27 MED ORDER — BUPIVACAINE LIPOSOME 1.3 % IJ SUSP
20.0000 mL | Freq: Once | INTRAMUSCULAR | Status: DC
  Filled 2016-12-27: qty 20

## 2016-12-27 MED ORDER — EPHEDRINE SULFATE 50 MG/ML IJ SOLN
INTRAMUSCULAR | Status: DC | PRN
  Administered 2016-12-27 (×3): 10 mg via INTRAVENOUS

## 2016-12-27 MED ORDER — ACETAMINOPHEN 10 MG/ML IV SOLN
INTRAVENOUS | Status: AC
Start: 2016-12-27 — End: 2016-12-27
  Filled 2016-12-27: qty 100

## 2016-12-27 MED ORDER — HYDROCHLOROTHIAZIDE 25 MG PO TABS
12.5000 mg | ORAL_TABLET | Freq: Every day | ORAL | Status: DC
  Administered 2016-12-29: 12.5 mg via ORAL
  Filled 2016-12-27 (×2): qty 1

## 2016-12-27 MED ORDER — DEXAMETHASONE SODIUM PHOSPHATE 10 MG/ML IJ SOLN
10.0000 mg | Freq: Once | INTRAMUSCULAR | Status: AC
  Administered 2016-12-28: 10 mg via INTRAVENOUS
  Filled 2016-12-27: qty 1

## 2016-12-27 MED ORDER — EPHEDRINE 5 MG/ML INJ
INTRAVENOUS | Status: AC
  Filled 2016-12-27: qty 10

## 2016-12-27 MED ORDER — BUPIVACAINE IN DEXTROSE 0.75-8.25 % IT SOLN
INTRATHECAL | Status: DC | PRN
  Administered 2016-12-27: 1.8 mL via INTRATHECAL

## 2016-12-27 MED ORDER — PROPOFOL 500 MG/50ML IV EMUL
INTRAVENOUS | Status: DC | PRN
  Administered 2016-12-27: 100 ug/kg/min via INTRAVENOUS

## 2016-12-27 MED ORDER — FENTANYL CITRATE (PF) 100 MCG/2ML IJ SOLN
INTRAMUSCULAR | Status: AC
  Filled 2016-12-27: qty 2

## 2016-12-27 MED ORDER — PROPOFOL 10 MG/ML IV BOLUS
INTRAVENOUS | Status: AC
  Filled 2016-12-27: qty 20

## 2016-12-27 MED ORDER — ACETAMINOPHEN 500 MG PO TABS
1000.0000 mg | ORAL_TABLET | Freq: Four times a day (QID) | ORAL | Status: AC
  Administered 2016-12-27 – 2016-12-28 (×3): 1000 mg via ORAL
  Filled 2016-12-27 (×3): qty 2

## 2016-12-27 MED ORDER — FLEET ENEMA 7-19 GM/118ML RE ENEM: 1.0000 | ENEMA | Freq: Once | RECTAL | Status: DC | PRN

## 2016-12-27 MED ORDER — CEFAZOLIN SODIUM-DEXTROSE 2-4 GM/100ML-% IV SOLN
2.0000 g | Freq: Four times a day (QID) | INTRAVENOUS | Status: AC
  Administered 2016-12-27 – 2016-12-28 (×2): 2 g via INTRAVENOUS
  Filled 2016-12-27 (×2): qty 100

## 2016-12-27 MED ORDER — CEFAZOLIN SODIUM-DEXTROSE 2-4 GM/100ML-% IV SOLN
2.0000 g | INTRAVENOUS | Status: AC
  Administered 2016-12-27: 2 g via INTRAVENOUS

## 2016-12-27 MED ORDER — ONDANSETRON HCL 4 MG/2ML IJ SOLN: 4.0000 mg | Freq: Four times a day (QID) | INTRAMUSCULAR | Status: DC | PRN

## 2016-12-27 MED ORDER — BUPIVACAINE HCL (PF) 0.5 % IJ SOLN
INTRAMUSCULAR | Status: AC
Start: 2016-12-27 — End: 2016-12-27
  Filled 2016-12-27: qty 30

## 2016-12-27 MED ORDER — ONDANSETRON HCL 4 MG PO TABS: 4.0000 mg | ORAL_TABLET | Freq: Four times a day (QID) | ORAL | Status: DC | PRN

## 2016-12-27 MED ORDER — MORPHINE SULFATE (PF) 2 MG/ML IV SOLN
1.0000 mg | INTRAVENOUS | Status: DC | PRN
  Administered 2016-12-28: 1 mg via INTRAVENOUS
  Filled 2016-12-27: qty 1

## 2016-12-27 MED ORDER — SODIUM CHLORIDE 0.9 % IJ SOLN
INTRAMUSCULAR | Status: AC
  Filled 2016-12-27: qty 50

## 2016-12-27 MED ORDER — METHOCARBAMOL 500 MG PO TABS
500.0000 mg | ORAL_TABLET | Freq: Four times a day (QID) | ORAL | Status: DC | PRN
  Administered 2016-12-27 – 2016-12-29 (×3): 500 mg via ORAL
  Filled 2016-12-27 (×3): qty 1

## 2016-12-27 MED ORDER — SODIUM CHLORIDE 0.9 % IJ SOLN
INTRAMUSCULAR | Status: DC | PRN
  Administered 2016-12-27: 50 mL via INTRAVENOUS

## 2016-12-27 MED ORDER — CEFAZOLIN SODIUM-DEXTROSE 2-4 GM/100ML-% IV SOLN
INTRAVENOUS | Status: AC
  Filled 2016-12-27: qty 100

## 2016-12-27 MED ORDER — ONDANSETRON HCL 4 MG/2ML IJ SOLN
INTRAMUSCULAR | Status: DC | PRN
  Administered 2016-12-27: 4 mg via INTRAVENOUS

## 2016-12-27 MED ORDER — SODIUM CHLORIDE 0.9 % IV SOLN
INTRAVENOUS | Status: DC | PRN
  Administered 2016-12-27: 50 ug/min via INTRAVENOUS

## 2016-12-27 MED ORDER — DIPHENHYDRAMINE HCL 12.5 MG/5ML PO ELIX
12.5000 mg | ORAL_SOLUTION | ORAL | Status: DC | PRN
  Administered 2016-12-28: 25 mg via ORAL
  Administered 2016-12-28 (×2): 12.5 mg via ORAL
  Administered 2016-12-28 (×3): 25 mg via ORAL
  Filled 2016-12-27: qty 5
  Filled 2016-12-27 (×3): qty 10
  Filled 2016-12-27: qty 5
  Filled 2016-12-27: qty 10

## 2016-12-27 MED ORDER — FENTANYL CITRATE (PF) 100 MCG/2ML IJ SOLN
50.0000 ug | INTRAMUSCULAR | Status: DC | PRN
  Administered 2016-12-27: 100 ug via INTRAVENOUS

## 2016-12-27 MED ORDER — METOCLOPRAMIDE HCL 5 MG PO TABS: 5.0000 mg | ORAL_TABLET | Freq: Three times a day (TID) | ORAL | Status: DC | PRN

## 2016-12-27 MED ORDER — METOCLOPRAMIDE HCL 5 MG/ML IJ SOLN: 5.0000 mg | Freq: Three times a day (TID) | INTRAMUSCULAR | Status: DC | PRN

## 2016-12-27 MED ORDER — CHLORHEXIDINE GLUCONATE 4 % EX LIQD: 60.0000 mL | Freq: Once | CUTANEOUS | Status: DC

## 2016-12-27 MED ORDER — PHENOL 1.4 % MT LIQD: 1.0000 | OROMUCOSAL | Status: DC | PRN

## 2016-12-27 MED ORDER — DEXAMETHASONE SODIUM PHOSPHATE 10 MG/ML IJ SOLN
10.0000 mg | Freq: Once | INTRAMUSCULAR | Status: AC
  Administered 2016-12-27: 10 mg via INTRAVENOUS

## 2016-12-27 MED ORDER — POLYETHYLENE GLYCOL 3350 17 G PO PACK: 17.0000 g | PACK | Freq: Every day | ORAL | Status: DC | PRN

## 2016-12-27 MED ORDER — LACTATED RINGERS IV SOLN
INTRAVENOUS | Status: DC
  Administered 2016-12-27 (×2): via INTRAVENOUS

## 2016-12-27 MED ORDER — BISACODYL 10 MG RE SUPP: 10.0000 mg | Freq: Every day | RECTAL | Status: DC | PRN

## 2016-12-27 MED ORDER — FENTANYL CITRATE (PF) 100 MCG/2ML IJ SOLN
INTRAMUSCULAR | Status: DC | PRN
  Administered 2016-12-27 (×2): 50 ug via INTRAVENOUS

## 2016-12-27 MED ORDER — DEXAMETHASONE SODIUM PHOSPHATE 10 MG/ML IJ SOLN
INTRAMUSCULAR | Status: AC
  Filled 2016-12-27: qty 1

## 2016-12-27 MED ORDER — MIDAZOLAM HCL 2 MG/2ML IJ SOLN
1.0000 mg | INTRAMUSCULAR | Status: DC | PRN
  Administered 2016-12-27: 2 mg via INTRAVENOUS
  Filled 2016-12-27: qty 2

## 2016-12-27 MED ORDER — PHENYLEPHRINE HCL 10 MG/ML IJ SOLN
INTRAMUSCULAR | Status: AC
  Filled 2016-12-27: qty 1

## 2016-12-27 MED ORDER — SODIUM CHLORIDE 0.9 % IV SOLN: 30.0000 ug/min | INTRAVENOUS | Status: DC

## 2016-12-27 MED ORDER — ACETAMINOPHEN 10 MG/ML IV SOLN
1000.0000 mg | Freq: Once | INTRAVENOUS | Status: AC
  Administered 2016-12-27: 1000 mg via INTRAVENOUS

## 2016-12-27 MED ORDER — PROPOFOL 10 MG/ML IV BOLUS
INTRAVENOUS | Status: AC
  Filled 2016-12-27: qty 40

## 2016-12-27 MED ORDER — OXYCODONE HCL 5 MG PO TABS
5.0000 mg | ORAL_TABLET | ORAL | Status: DC | PRN
  Administered 2016-12-27: 5 mg via ORAL
  Administered 2016-12-27: 10 mg via ORAL
  Administered 2016-12-27: 19:00:00 5 mg via ORAL
  Administered 2016-12-28 (×2): 10 mg via ORAL
  Filled 2016-12-27: qty 1
  Filled 2016-12-27 (×3): qty 2
  Filled 2016-12-27: qty 1

## 2016-12-27 MED ORDER — ACETAMINOPHEN 650 MG RE SUPP: 650.0000 mg | Freq: Four times a day (QID) | RECTAL | Status: DC | PRN

## 2016-12-27 MED ORDER — TRAMADOL HCL 50 MG PO TABS
50.0000 mg | ORAL_TABLET | Freq: Four times a day (QID) | ORAL | Status: DC | PRN
  Administered 2016-12-28: 23:00:00 100 mg via ORAL
  Filled 2016-12-27: qty 2

## 2016-12-27 MED ORDER — BUPIVACAINE LIPOSOME 1.3 % IJ SUSP
INTRAMUSCULAR | Status: DC | PRN
  Administered 2016-12-27: 20 mL

## 2016-12-27 MED ORDER — ONDANSETRON HCL 4 MG/2ML IJ SOLN
INTRAMUSCULAR | Status: AC
  Filled 2016-12-27: qty 2

## 2016-12-27 MED ORDER — SODIUM CHLORIDE 0.9 % IV SOLN
INTRAVENOUS | Status: DC
Start: 2016-12-27 — End: 2016-12-29
  Administered 2016-12-27: 18:00:00 via INTRAVENOUS

## 2016-12-27 MED ORDER — ACETAMINOPHEN 325 MG PO TABS: 650.0000 mg | ORAL_TABLET | Freq: Four times a day (QID) | ORAL | Status: DC | PRN

## 2016-12-27 MED ORDER — FENTANYL CITRATE (PF) 100 MCG/2ML IJ SOLN: 25.0000 ug | INTRAMUSCULAR | Status: DC | PRN

## 2016-12-27 MED ORDER — TRANEXAMIC ACID 1000 MG/10ML IV SOLN
1000.0000 mg | Freq: Once | INTRAVENOUS | Status: AC
  Administered 2016-12-27: 1000 mg via INTRAVENOUS
  Filled 2016-12-27: qty 10

## 2016-12-27 MED ORDER — EPINEPHRINE PF 1 MG/ML IJ SOLN
INTRAMUSCULAR | Status: AC
  Filled 2016-12-27: qty 1

## 2016-12-27 MED ORDER — DOCUSATE SODIUM 100 MG PO CAPS
100.0000 mg | ORAL_CAPSULE | Freq: Two times a day (BID) | ORAL | Status: DC
  Administered 2016-12-27 – 2016-12-29 (×4): 100 mg via ORAL
  Filled 2016-12-27 (×4): qty 1

## 2016-12-27 MED ORDER — RIVAROXABAN 10 MG PO TABS
10.0000 mg | ORAL_TABLET | Freq: Every day | ORAL | Status: DC
  Administered 2016-12-28 – 2016-12-29 (×2): 10 mg via ORAL
  Filled 2016-12-27 (×2): qty 1

## 2016-12-27 MED ORDER — MENTHOL 3 MG MT LOZG: 1.0000 | LOZENGE | OROMUCOSAL | Status: DC | PRN

## 2016-12-27 SURGICAL SUPPLY — 53 items
BAG DECANTER FOR FLEXI CONT (MISCELLANEOUS) IMPLANT
BAG ZIPLOCK 12X15 (MISCELLANEOUS) ×2 IMPLANT
BANDAGE ACE 6X5 VEL STRL LF (GAUZE/BANDAGES/DRESSINGS) ×2 IMPLANT
BLADE SAG 18X100X1.27 (BLADE) ×2 IMPLANT
BLADE SAW SGTL 11.0X1.19X90.0M (BLADE) ×2 IMPLANT
BOWL SMART MIX CTS (DISPOSABLE) ×2 IMPLANT
CAPT KNEE TOTAL 3 ATTUNE ×2 IMPLANT
CEMENT HV SMART SET (Cement) ×4 IMPLANT
CLOTH BEACON ORANGE TIMEOUT ST (SAFETY) ×2 IMPLANT
CUFF TOURN SGL QUICK 34 (TOURNIQUET CUFF) ×1
CUFF TRNQT CYL 34X4X40X1 (TOURNIQUET CUFF) ×1 IMPLANT
DECANTER SPIKE VIAL GLASS SM (MISCELLANEOUS) ×2 IMPLANT
DRAPE U-SHAPE 47X51 STRL (DRAPES) ×2 IMPLANT
DRSG ADAPTIC 3X8 NADH LF (GAUZE/BANDAGES/DRESSINGS) ×2 IMPLANT
DRSG PAD ABDOMINAL 8X10 ST (GAUZE/BANDAGES/DRESSINGS) ×2 IMPLANT
DURAPREP 26ML APPLICATOR (WOUND CARE) ×2 IMPLANT
ELECT REM PT RETURN 9FT ADLT (ELECTROSURGICAL) ×2
ELECTRODE REM PT RTRN 9FT ADLT (ELECTROSURGICAL) ×1 IMPLANT
EVACUATOR 1/8 PVC DRAIN (DRAIN) ×2 IMPLANT
GAUZE SPONGE 4X4 12PLY STRL (GAUZE/BANDAGES/DRESSINGS) ×2 IMPLANT
GLOVE BIO SURGEON STRL SZ7.5 (GLOVE) IMPLANT
GLOVE BIO SURGEON STRL SZ8 (GLOVE) ×2 IMPLANT
GLOVE BIOGEL PI IND STRL 6.5 (GLOVE) IMPLANT
GLOVE BIOGEL PI IND STRL 8 (GLOVE) ×1 IMPLANT
GLOVE BIOGEL PI INDICATOR 6.5 (GLOVE)
GLOVE BIOGEL PI INDICATOR 8 (GLOVE) ×1
GLOVE SURG SS PI 6.5 STRL IVOR (GLOVE) IMPLANT
GOWN STRL REUS W/TWL LRG LVL3 (GOWN DISPOSABLE) ×2 IMPLANT
GOWN STRL REUS W/TWL XL LVL3 (GOWN DISPOSABLE) IMPLANT
HANDPIECE INTERPULSE COAX TIP (DISPOSABLE) ×1
IMMOBILIZER KNEE 20 (SOFTGOODS) ×2
IMMOBILIZER KNEE 20 THIGH 36 (SOFTGOODS) ×1 IMPLANT
IMMOBILIZER KNEE 22 UNIV (SOFTGOODS) ×2 IMPLANT
MANIFOLD NEPTUNE II (INSTRUMENTS) ×2 IMPLANT
NS IRRIG 1000ML POUR BTL (IV SOLUTION) ×2 IMPLANT
PACK TOTAL KNEE CUSTOM (KITS) ×2 IMPLANT
PADDING CAST ABS 4INX4YD NS (CAST SUPPLIES) ×1
PADDING CAST ABS 6INX4YD NS (CAST SUPPLIES) ×1
PADDING CAST ABS COTTON 4X4 ST (CAST SUPPLIES) ×1 IMPLANT
PADDING CAST ABS COTTON 6X4 NS (CAST SUPPLIES) ×1 IMPLANT
PADDING CAST COTTON 6X4 STRL (CAST SUPPLIES) ×6 IMPLANT
POSITIONER SURGICAL ARM (MISCELLANEOUS) ×2 IMPLANT
SET HNDPC FAN SPRY TIP SCT (DISPOSABLE) ×1 IMPLANT
STRIP CLOSURE SKIN 1/2X4 (GAUZE/BANDAGES/DRESSINGS) ×4 IMPLANT
SUT MNCRL AB 4-0 PS2 18 (SUTURE) ×2 IMPLANT
SUT VIC AB 2-0 CT1 27 (SUTURE) ×3
SUT VIC AB 2-0 CT1 TAPERPNT 27 (SUTURE) ×3 IMPLANT
SUT VLOC 180 0 24IN GS25 (SUTURE) ×2 IMPLANT
SYR 50ML LL SCALE MARK (SYRINGE) ×2 IMPLANT
TRAY FOLEY W/METER SILVER 16FR (SET/KITS/TRAYS/PACK) ×2 IMPLANT
WATER STERILE IRR 1500ML POUR (IV SOLUTION) ×2 IMPLANT
WRAP KNEE MAXI GEL POST OP (GAUZE/BANDAGES/DRESSINGS) ×2 IMPLANT
YANKAUER SUCT BULB TIP 10FT TU (MISCELLANEOUS) ×2 IMPLANT

## 2016-12-27 NOTE — Anesthesia Procedure Notes (Addendum)
Spinal  Patient location during procedure: OR End time: 12/27/2016 1:22 PM Staffing Anesthesiologist: Belinda Block Resident/CRNA: Maxwell Caul Performed: resident/CRNA  Preanesthetic Checklist Completed: patient identified, site marked, surgical consent, pre-op evaluation, timeout performed, IV checked, risks and benefits discussed and monitors and equipment checked Spinal Block Patient position: sitting Prep: DuraPrep Patient monitoring: heart rate, continuous pulse ox and blood pressure Approach: midline Location: L4-5 Injection technique: single-shot Needle Needle type: Pencan  Needle length: 10 cm Additional Notes Sitting for SAB placement. Single shot. Negative heme, patient tolerated well. LOT # [id number]. Expiration date:05-05-2018. Dr Nyoka Cowden at bedside during placement of spinal.

## 2016-12-27 NOTE — H&P (View-Only) (Signed)
John Buck DOB: 08-16-1956 Married / Language: English / Race: White Male Date of Admission:  12/27/2016 CC:  Right Knee Pain History of Present Illness  The patient is a 61 year old male who comes in  for a preoperative History and Physical. The patient is scheduled for a right total knee arthroplasty to be performed by Dr. Dione Plover. Aluisio, MD at [facility] on 12-27-2016. The patient is a 61 year old male who presented with knee complaints. The patient reports right knee symptoms including: pain, swelling, stiffness and limping which began year(s) ago without any known injury. The patient describes their pain as aching.The patient feels that the symptoms are worsening. Past treatment for this problem has included intra-articular injection of corticosteroids (Dr. Onnie Graham gave pt. cortisone injection years ago: helped some). Symptoms are reported to be located in the right knee and right anterior knee and include knee pain, anterior knee pain, swelling and stiffness. The patient does not report any radiation of symptoms. Symptoms are exacerbated by motion at the knee. Symptoms are relieved by rest. Current treatment includes nonsteroidal anti-inflammatory drugs (Ibuprofen prn). Right knee is getting progressively worse over time. He has had multiple injections, no longer any benefit. At this point, the knee has substantially taken over his life. It is limiting what he can and cannot do. He has some discomfort in left, but nowhere near as bad as the right. It does feel like it wants to give out at times. He is now at a point where he would like to get the knee fixed. They have been treated conservatively in the past for the above stated problem and despite conservative measures, they continue to have progressive pain and severe functional limitations and dysfunction. They have failed non-operative management including home exercise, medications, and injections. It is felt that they would benefit  from undergoing total joint replacement. Risks and benefits of the procedure have been discussed with the patient and they elect to proceed with surgery. There are no active contraindications to surgery such as ongoing infection or rapidly progressive neurological disease.   Problem List/Past Medical  Right knee pain (M25.561)  Primary osteoarthritis of right knee (M17.11)  Irritable bowel syndrome  Kidney Stone  Diverticulosis  Hemorrhoids  Measles  Mumps   Allergies  STADOL [11/13/2008]: Hallucinations DILAUDID [11/13/2008]: Itching.  Family History  Hypertension  Brother, Mother.  Social History  Children  3 Current work status  working full time Exercise  Exercises rarely Living situation  live with spouse Marital status  married Never consumed alcohol  08/01/2014: Never consumed alcohol No history of drug/alcohol rehab  Not under pain contract  Number of flights of stairs before winded  2-3 Tobacco / smoke exposure  08/01/2014: no Tobacco use  Never smoker. 08/01/2014 Advance Directives  Living Will, Healthcare POA  Medication History Aspirin (325MG  Tablet, 1 (one) Oral) Active. Glucosamine (500MG  Capsule, Oral) Active. Probiotic Daily (Oral) Active. Ibuprofen (prn) Active. Hydrochlorothiazide (12.5MG  Capsule, Oral) Active.  Past Surgical History  Arthroscopy of Knee  right Colon Polyp Removal - Colonoscopy  Vasectomy   Review of Systems  General Not Present- Chills, Fatigue, Fever, Memory Loss, Night Sweats, Weight Gain and Weight Loss. Skin Not Present- Eczema, Hives, Itching, Lesions and Rash. HEENT Not Present- Dentures, Double Vision, Headache, Hearing Loss, Tinnitus and Visual Loss. Respiratory Not Present- Allergies, Chronic Cough, Coughing up blood, Shortness of breath at rest and Shortness of breath with exertion. Cardiovascular Not Present- Chest Pain, Difficulty Breathing Lying Down,  Murmur, Palpitations,  Racing/skipping heartbeats and Swelling. Gastrointestinal Not Present- Abdominal Pain, Bloody Stool, Constipation, Diarrhea, Difficulty Swallowing, Heartburn, Jaundice, Loss of appetitie, Nausea and Vomiting. Male Genitourinary Not Present- Blood in Urine, Discharge, Flank Pain, Incontinence, Painful Urination, Urgency, Urinary frequency, Urinary Retention, Urinating at Night and Weak urinary stream. Musculoskeletal Present- Joint Pain. Not Present- Back Pain, Joint Swelling, Morning Stiffness, Muscle Pain, Muscle Weakness and Spasms. Neurological Not Present- Blackout spells, Difficulty with balance, Dizziness, Paralysis, Tremor and Weakness. Psychiatric Not Present- Insomnia.  Vitals Weight: 221 lb Height: 70in Body Surface Area: 2.18 m Body Mass Index: 31.71 kg/m  Pulse: 76 (Regular)  BP: 132/82 (Sitting, Right Arm, Standard)   Physical Exam General Mental Status -Alert, cooperative and good historian. General Appearance-pleasant, Not in acute distress. Orientation-Oriented X3. Build & Nutrition-Well nourished and Well developed.  Head and Neck Head-normocephalic, atraumatic . Neck Global Assessment - supple, no bruit auscultated on the right, no bruit auscultated on the left.  Eye Pupil - Bilateral-Regular and Round. Motion - Bilateral-EOMI.  Chest and Lung Exam Auscultation Breath sounds - clear at anterior chest wall and clear at posterior chest wall. Adventitious sounds - No Adventitious sounds.  Cardiovascular Auscultation Rhythm - Regular rate and rhythm. Heart Sounds - S1 WNL and S2 WNL. Murmurs & Other Heart Sounds - Auscultation of the heart reveals - No Murmurs.  Abdomen Palpation/Percussion Tenderness - Abdomen is non-tender to palpation. Rigidity (guarding) - Abdomen is soft. Auscultation Auscultation of the abdomen reveals - Bowel sounds normal.  Male Genitourinary Note: Not done, not pertinent to present  illness  Musculoskeletal Note: On exam, a well-developed male, in no distress. Evaluation of his hips show normal range of motion with no discomfort. Evaluation of the left knee, no effusion. Range of motion is 0 to 135 with no tenderness or instability. Right knee, slight varus, range is 5 to 125, marked crepitus on range of motion with tenderness medial greater than lateral with no instability noted. Pulse, sensation and motor are intact both lower extremities. He has got a significantly antalgic gait pattern on the right.  RADIOGRAPHS AP both knees and lateral show bone on bone arthritis medial patellofemoral compartments of the right knee. He has some arthritic change in left, but not as bad.  Assessment & Plan Primary osteoarthritis of right knee (M17.11)  Note:Surgical Plans: Right Total Knee Replacement  Disposition: Home  PCP: Dr. Moreen Fowler - Patient has been seen preoperatively and felt to be stable for surgery.  IV Topical TXA  Anesthesia Issues: None  Signed electronically by John Buck, John Buck

## 2016-12-27 NOTE — Anesthesia Procedure Notes (Signed)
Anesthesia Regional Block:  Adductor canal block  Pre-Anesthetic Checklist: ,, timeout performed, Correct Patient, Correct Site, Correct Laterality, Correct Procedure, Correct Position, site marked, Risks and benefits discussed,  Surgical consent,  Pre-op evaluation,  At surgeon's request and post-op pain management  Laterality: Right  Prep: chloraprep        Procedures: Doppler guided and ultrasound guided (picture in chart) Adductor canal block Narrative:  Start time: 12/27/2016 12:35 PM End time: 12/27/2016 12:50 PM Injection made incrementally with aspirations every 5 mL.  Performed by: Personally  Anesthesiologist: Belinda Block

## 2016-12-27 NOTE — Interval H&P Note (Signed)
History and Physical Interval Note:  12/27/2016 12:35 PM  John Buck  has presented today for surgery, with the diagnosis of right knee osteoarthritis  The various methods of treatment have been discussed with the patient and family. After consideration of risks, benefits and other options for treatment, the patient has consented to  Procedure(s): RIGHT TOTAL KNEE ARTHROPLASTY (Right) as a surgical intervention .  The patient's history has been reviewed, patient examined, no change in status, stable for surgery.  I have reviewed the patient's chart and labs.  Questions were answered to the patient's satisfaction.     Gearlean Alf

## 2016-12-27 NOTE — Op Note (Signed)
OPERATIVE REPORT-TOTAL KNEE ARTHROPLASTY   Pre-operative diagnosis- Osteoarthritis  Right knee(s)  Post-operative diagnosis- Osteoarthritis Right knee(s)  Procedure-  Right  Total Knee Arthroplasty  Surgeon- Dione Plover. Trinaty Bundrick, MD  Assistant- Arlee Muslim, PA-C   Anesthesia-  Adductor canal block and spinal  EBL-* No blood loss amount entered *   Drains Hemovac  Tourniquet time-  Total Tourniquet Time Documented: Thigh (Right) - 42 minutes Total: Thigh (Right) - 42 minutes     Complications- None  Condition-PACU - hemodynamically stable.   Brief Clinical Note  John Buck is a 61 y.o. year old male with end stage OA of his right knee with progressively worsening pain and dysfunction. He has constant pain, with activity and at rest and significant functional deficits with difficulties even with ADLs. He has had extensive non-op management including analgesics, injections of cortisone, and home exercise program, but remains in significant pain with significant dysfunction. Radiographs show bone on bone arthritis medial and patellofemoral. He presents now for right Total Knee Arthroplasty.    Procedure in detail---   The patient is brought into the operating room and positioned supine on the operating table. After successful administration of  Adductor canal block and spinal,   a tourniquet is placed high on the  Right thigh(s) and the lower extremity is prepped and draped in the usual sterile fashion. Time out is performed by the operating team and then the  Right lower extremity is wrapped in Esmarch, knee flexed and the tourniquet inflated to 300 mmHg.       A midline incision is made with a ten blade through the subcutaneous tissue to the level of the extensor mechanism. A fresh blade is used to make a medial parapatellar arthrotomy. Soft tissue over the proximal medial tibia is subperiosteally elevated to the joint line with a knife and into the semimembranosus bursa with a  Cobb elevator. Soft tissue over the proximal lateral tibia is elevated with attention being paid to avoiding the patellar tendon on the tibial tubercle. The patella is everted, knee flexed 90 degrees and the ACL and PCL are removed. Findings are bone on bone medial and patellofemoral with massive global osteophytes.        The drill is used to create a starting hole in the distal femur and the canal is thoroughly irrigated with sterile saline to remove the fatty contents. The 5 degree Right  valgus alignment guide is placed into the femoral canal and the distal femoral cutting block is pinned to remove 10 mm off the distal femur. Resection is made with an oscillating saw.      The tibia is subluxed forward and the menisci are removed. The extramedullary alignment guide is placed referencing proximally at the medial aspect of the tibial tubercle and distally along the second metatarsal axis and tibial crest. The block is pinned to remove 70mm off the more deficient medial  side. Resection is made with an oscillating saw. Size 7is the most appropriate size for the tibia and the proximal tibia is prepared with the modular drill and keel punch for that size.      The femoral sizing guide is placed and size 7 is most appropriate. Rotation is marked off the epicondylar axis and confirmed by creating a rectangular flexion gap at 90 degrees. The size 7 cutting block is pinned in this rotation and the anterior, posterior and chamfer cuts are made with the oscillating saw. The intercondylar block is then placed and that cut  is made.      Trial size 7 tibial component, trial size 7 posterior stabilized femur and a 7  mm posterior stabilized rotating platform insert trial is placed. Full extension is achieved with excellent varus/valgus and anterior/posterior balance throughout full range of motion. The patella is everted and thickness measured to be 27  mm. Free hand resection is taken to 15 mm, a 41 template is placed, lug  holes are drilled, trial patella is placed, and it tracks normally. Osteophytes are removed off the posterior femur with the trial in place. All trials are removed and the cut bone surfaces prepared with pulsatile lavage. Cement is mixed and once ready for implantation, the size 7 tibial implant, size  7 posterior stabilized femoral component, and the size 41 patella are cemented in place and the patella is held with the clamp. The trial insert is placed and the knee held in full extension. The Exparel (20 ml mixed with 30 ml saline) and .25% Bupivicaine, are injected into the extensor mechanism, posterior capsule, medial and lateral gutters and subcutaneous tissues.  All extruded cement is removed and once the cement is hard the permanent 7 mm posterior stabilized rotating platform insert is placed into the tibial tray.      The wound is copiously irrigated with saline solution and the extensor mechanism closed over a hemovac drain with #1 V-loc suture. The tourniquet is released for a total tourniquet time of 42  minutes. Flexion against gravity is 140 degrees and the patella tracks normally. Subcutaneous tissue is closed with 2.0 vicryl and subcuticular with running 4.0 Monocryl. The incision is cleaned and dried and steri-strips and a bulky sterile dressing are applied. The limb is placed into a knee immobilizer and the patient is awakened and transported to recovery in stable condition.      Please note that a surgical assistant was a medical necessity for this procedure in order to perform it in a safe and expeditious manner. Surgical assistant was necessary to retract the ligaments and vital neurovascular structures to prevent injury to them and also necessary for proper positioning of the limb to allow for anatomic placement of the prosthesis.   Dione Plover Lillybeth Tal, MD    12/27/2016, 2:33 PM

## 2016-12-27 NOTE — Transfer of Care (Signed)
Immediate Anesthesia Transfer of Care Note  Patient: John Buck  Procedure(s) Performed: Procedure(s): RIGHT TOTAL KNEE ARTHROPLASTY (Right)  Patient Location: PACU  Anesthesia Type:MAC and Spinal  Level of Consciousness: awake, alert  and oriented  Airway & Oxygen Therapy: Patient Spontanous Breathing and Patient connected to face mask oxygen  Post-op Assessment: Report given to RN and Post -op Vital signs reviewed and stable  Post vital signs: Reviewed and stable  Last Vitals:  Vitals:   12/27/16 1306 12/27/16 1507  BP:    Pulse: 83 80  Resp: 15 20  Temp:      Last Pain:  Vitals:   12/27/16 1034  TempSrc: Oral      Patients Stated Pain Goal: 3 (26/41/58 3094)  Complications: No apparent anesthesia complications

## 2016-12-27 NOTE — Anesthesia Postprocedure Evaluation (Signed)
Anesthesia Post Note  Patient: John Buck  Procedure(s) Performed: Procedure(s) (LRB): RIGHT TOTAL KNEE ARTHROPLASTY (Right)  Patient location during evaluation: PACU Anesthesia Type: Spinal Level of consciousness: awake Pain management: pain level controlled Vital Signs Assessment: post-procedure vital signs reviewed and stable Respiratory status: spontaneous breathing Cardiovascular status: stable       Last Vitals:  Vitals:   12/27/16 1545 12/27/16 1600  BP: 117/85 (!) 133/91  Pulse: 68 67  Resp: 12 15  Temp:      Last Pain:  Vitals:   12/27/16 1530  TempSrc:   PainSc: 0-No pain                 Leticia Coletta

## 2016-12-27 NOTE — Progress Notes (Signed)
Assisted Dr Greenwith right, ultrasound guided, adductor canal block. Side rails up, monitors on throughout procedure. See vital signs in flow sheet. Tolerated Procedure well.  

## 2016-12-27 NOTE — Anesthesia Preprocedure Evaluation (Signed)
Anesthesia Evaluation  Patient identified by MRN, date of birth, ID band Patient awake    Reviewed: Allergy & Precautions, NPO status , Patient's Chart, lab work & pertinent test results  Airway Mallampati: II  TM Distance: >3 FB     Dental   Pulmonary neg pulmonary ROS,    breath sounds clear to auscultation       Cardiovascular hypertension,  Rhythm:Regular Rate:Normal     Neuro/Psych    GI/Hepatic negative GI ROS, Neg liver ROS,   Endo/Other  negative endocrine ROS  Renal/GU negative Renal ROS     Musculoskeletal  (+) Arthritis ,   Abdominal   Peds  Hematology   Anesthesia Other Findings   Reproductive/Obstetrics                             Anesthesia Physical Anesthesia Plan  ASA: II  Anesthesia Plan: Spinal   Post-op Pain Management:  Regional for Post-op pain   Induction: Intravenous  Airway Management Planned: Simple Face Mask  Additional Equipment:   Intra-op Plan:   Post-operative Plan:   Informed Consent: I have reviewed the patients History and Physical, chart, labs and discussed the procedure including the risks, benefits and alternatives for the proposed anesthesia with the patient or authorized representative who has indicated his/her understanding and acceptance.   Dental advisory given  Plan Discussed with: CRNA and Anesthesiologist  Anesthesia Plan Comments:         Anesthesia Quick Evaluation

## 2016-12-28 ENCOUNTER — Encounter (HOSPITAL_COMMUNITY): Payer: Self-pay | Admitting: Orthopedic Surgery

## 2016-12-28 LAB — BASIC METABOLIC PANEL
Anion gap: 6 (ref 5–15)
BUN: 21 mg/dL — ABNORMAL HIGH (ref 6–20)
CO2: 22 mmol/L (ref 22–32)
Calcium: 8 mg/dL — ABNORMAL LOW (ref 8.9–10.3)
Chloride: 107 mmol/L (ref 101–111)
Creatinine, Ser: 0.83 mg/dL (ref 0.61–1.24)
GFR calc Af Amer: >60 mL/min (ref >60)
GFR calc non Af Amer: >60 mL/min (ref >60)
Glucose, Bld: 146 mg/dL — ABNORMAL HIGH (ref 65–99)
Potassium: 4.6 mmol/L (ref 3.5–5.1)
Sodium: 135 mmol/L (ref 135–145)

## 2016-12-28 LAB — CBC
HCT: 39.1 % (ref 39.0–52.0)
Hemoglobin: 13.6 g/dL (ref 13.0–17.0)
MCH: 30.7 pg (ref 26.0–34.0)
MCHC: 34.8 g/dL (ref 30.0–36.0)
MCV: 88.3 fL (ref 78.0–100.0)
Platelets: 250 10*3/uL (ref 150–400)
RBC: 4.43 MIL/uL (ref 4.22–5.81)
RDW: 12.4 % (ref 11.5–15.5)
WBC: 15.9 10*3/uL — ABNORMAL HIGH (ref 4.0–10.5)

## 2016-12-28 MED ORDER — HYDROCODONE-ACETAMINOPHEN 5-325 MG PO TABS
1.0000 | ORAL_TABLET | ORAL | Status: DC | PRN
  Administered 2016-12-28 – 2016-12-29 (×4): 2 via ORAL
  Filled 2016-12-28 (×5): qty 2

## 2016-12-28 NOTE — Evaluation (Signed)
Physical Therapy Evaluation Patient Details Name: John Buck MRN: [id number] DOB: Sep 14, 1956 Today's Date: 12/28/2016   History of Present Illness  s/p R TKA  Clinical Impression  Pt is s/p TKA resulting in the deficits listed below (see PT Problem List).  Pt will benefit from skilled PT to increase their independence and safety with mobility to allow discharge to the venue listed below.      Follow Up Recommendations Home health PT    Equipment Recommendations  None recommended by PT    Recommendations for Other Services       Precautions / Restrictions Precautions Precautions: Fall;Knee Required Braces or Orthoses: Knee Immobilizer - Right Knee Immobilizer - Right: Discontinue once straight leg raise with < 10 degree lag Restrictions Weight Bearing Restrictions: No Other Position/Activity Restrictions: WBAT      Mobility  Bed Mobility Overal bed mobility: Needs Assistance Bed Mobility: Supine to Sit     Supine to sit: Supervision     General bed mobility comments: for safety  Transfers Overall transfer level: Needs assistance Equipment used: Rolling walker (2 wheeled) Transfers: Sit to/from Stand Sit to Stand: Min guard         General transfer comment: cues for hand placement and RLE position  Ambulation/Gait Ambulation/Gait assistance: Min guard Ambulation Distance (Feet): 130 Feet Assistive device: Rolling walker (2 wheeled) Gait Pattern/deviations: Step-through pattern     General Gait Details: cues for sequence and RW position  Stairs            Wheelchair Mobility    Modified Rankin (Stroke Patients Only)       Balance                                             Pertinent Vitals/Pain Pain Assessment: 0-10 Pain Location: right  Pain Descriptors / Indicators: Sore Pain Intervention(s): Limited activity within patient's tolerance;Monitored during session;Premedicated before session;Repositioned;Ice applied     Home Living Family/patient expects to be discharged to:: Private residence Living Arrangements: Spouse/significant other Available Help at Discharge: Family Type of Home: House Home Access: Stairs to enter Entrance Stairs-Rails: None Entrance Stairs-Number of Steps: 4 Home Layout: One level Home Equipment: Environmental consultant - 2 wheels;Bedside commode;Shower seat;Crutches Additional Comments: borrowed RW     Prior Function Level of Independence: Independent               Hand Dominance        Extremity/Trunk Assessment   Upper Extremity Assessment Upper Extremity Assessment: Defer to OT evaluation    Lower Extremity Assessment Lower Extremity Assessment: RLE deficits/detail RLE Deficits / Details: hip flexion and knee extension 3/5; ankle WFL       Communication   Communication: No difficulties  Cognition Arousal/Alertness: Awake/alert Behavior During Therapy: WFL for tasks assessed/performed Overall Cognitive Status: Within Functional Limits for tasks assessed                      General Comments      Exercises Total Joint Exercises Ankle Circles/Pumps: AROM;Both;10 reps   Assessment/Plan    PT Assessment Patient needs continued PT services  PT Problem List Decreased strength;Decreased range of motion;Decreased activity tolerance;Decreased mobility;Decreased safety awareness;Decreased knowledge of use of DME          PT Treatment Interventions DME instruction;Gait training;Functional mobility training;Therapeutic activities;Therapeutic exercise;Patient/family education;Stair training    PT  Goals (Current goals can be found in the Care Plan section)  Acute Rehab PT Goals Patient Stated Goal: back to Independence PT Goal Formulation: With patient Time For Goal Achievement: 12/31/16 Potential to Achieve Goals: Good    Frequency 7X/week   Barriers to discharge        Co-evaluation               End of Session Equipment Utilized During  Treatment: Gait belt Activity Tolerance: Patient tolerated treatment well Patient left: Other (comment) (up in room with OT)           Time: [id number] PT Time Calculation (min) (ACUTE ONLY): 13 min   Charges:   PT Evaluation $PT Eval Low Complexity: 1 Procedure     PT G Codes:        Paula Zietz Jan 07, 2017, 10:13 AM

## 2016-12-28 NOTE — Progress Notes (Signed)
Physical Therapy Treatment Patient Details Name: John Buck MRN: [id number] DOB: Apr 06, 1956 Today's Date: 12/28/2016    History of Present Illness s/p R TKA    PT Comments    Progressing well  With gait and knee ROM  Follow Up Recommendations  Home health PT     Equipment Recommendations  None recommended by PT    Recommendations for Other Services       Precautions / Restrictions Precautions Precautions: Fall;Knee Precaution Comments: reviewed no pillow under knee Required Braces or Orthoses: Knee Immobilizer - Right Knee Immobilizer - Right: Discontinue once straight leg raise with < 10 degree lag Restrictions Weight Bearing Restrictions: No Other Position/Activity Restrictions: WBAT    Mobility  Bed Mobility Overal bed mobility: Needs Assistance Bed Mobility: Sit to Supine     Supine to sit: Supervision Sit to supine: Supervision   General bed mobility comments: for safety  Transfers Overall transfer level: Needs assistance Equipment used: Rolling walker (2 wheeled) Transfers: Sit to/from Stand Sit to Stand: Min guard         General transfer comment: cues for hand placement and RLE position  Ambulation/Gait Ambulation/Gait assistance: Min guard Ambulation Distance (Feet): 100 Feet Assistive device: Rolling walker (2 wheeled) Gait Pattern/deviations: Step-through pattern;Decreased weight shift to right     General Gait Details: cues for sequence and RW position   Stairs            Wheelchair Mobility    Modified Rankin (Stroke Patients Only)       Balance     Sitting balance-Leahy Scale: Good     Standing balance support: During functional activity;Single extremity supported;Bilateral upper extremity supported Standing balance-Leahy Scale: Fair                      Cognition Arousal/Alertness: Awake/alert Behavior During Therapy: WFL for tasks assessed/performed Overall Cognitive Status: Within Functional Limits  for tasks assessed                      Exercises Total Joint Exercises Ankle Circles/Pumps: AROM;Both;10 reps Quad Sets: AROM;Strengthening;Both;10 reps Short Arc Quad: AROM;Strengthening;Both;10 reps Heel Slides: AROM;AAROM;Right;10 reps;Supine Hip ABduction/ADduction: Strengthening;AROM;Right;10 reps Straight Leg Raises: Strengthening;AROM;Right;10 reps Goniometric ROM: ~8* to 65*  right knee flexion    General Comments        Pertinent Vitals/Pain Pain Assessment: 0-10 Pain Score: 6  Pain Location: R knee Pain Descriptors / Indicators: Sore Pain Intervention(s): Monitored during session;Premedicated before session;Ice applied;Repositioned    Home Living Family/patient expects to be discharged to:: Private residence Living Arrangements: Spouse/significant other Available Help at Discharge: Family Type of Home: House Home Access: Stairs to enter Entrance Stairs-Rails: None Home Layout: One level Home Equipment: Environmental consultant - 2 wheels;Bedside commode;Shower seat;Crutches Additional Comments: borrowed RW     Prior Function Level of Independence: Independent          PT Goals (current goals can now be found in the care plan section) Acute Rehab PT Goals Patient Stated Goal: back to Independence PT Goal Formulation: With patient Time For Goal Achievement: 12/31/16 Potential to Achieve Goals: Good Progress towards PT goals: Progressing toward goals    Frequency    7X/week      PT Plan Current plan remains appropriate    Co-evaluation             End of Session Equipment Utilized During Treatment: Gait belt Activity Tolerance: Patient tolerated treatment well Patient left: in bed;with call bell/phone  within reach;with bed alarm set     Time: 1117-1140 PT Time Calculation (min) (ACUTE ONLY): 23 min  Charges:  $Gait Training: 8-22 mins $Therapeutic Exercise: 8-22 mins                    G Codes:      Seraphim Trow January 10, 2017, 12:46 PM

## 2016-12-28 NOTE — Evaluation (Signed)
Occupational Therapy Evaluation Patient Details Name: John Buck MRN: [id number] DOB: 10-Sep-1956 Today's Date: 12/28/2016    History of Present Illness s/p R TKA   Clinical Impression   Pt with decline in function and safety with ADLs and ADL mobility with decreased strength, balance and activity tolerance. Pt would benefit from acute OT services to address impairments to increase level of function and safety    Follow Up Recommendations  No OT follow up;Supervision - Intermittent    Equipment Recommendations  3 in 1 bedside commode;Tub/shower bench;Other (comment) Management consultant)    Recommendations for Other Services       Precautions / Restrictions Precautions Precautions: Fall;Knee Precaution Comments: reviewed no pillow under knee Required Braces or Orthoses: Knee Immobilizer - Right Knee Immobilizer - Right: Discontinue once straight leg raise with < 10 degree lag Restrictions Weight Bearing Restrictions: No Other Position/Activity Restrictions: WBAT      Mobility Bed Mobility Overal bed mobility: Needs Assistance Bed Mobility: Supine to Sit     Supine to sit: Supervision     General bed mobility comments: pt up ambulating with PT upon arrival  Transfers Overall transfer level: Needs assistance Equipment used: Rolling walker (2 wheeled) Transfers: Sit to/from Stand Sit to Stand: Min guard         General transfer comment: cues for hand placement and RLE position    Balance     Sitting balance-Leahy Scale: Good     Standing balance support: During functional activity;Single extremity supported;Bilateral upper extremity supported Standing balance-Leahy Scale: Fair                              ADL Overall ADL's : Needs assistance/impaired     Grooming: Wash/dry hands;Wash/dry face;Set up;Supervision/safety;Standing   Upper Body Bathing: Set up   Lower Body Bathing: Minimal assistance   Upper Body Dressing : Set up   Lower  Body Dressing: Minimal assistance   Toilet Transfer: Min guard;RW;Ambulation;Comfort height toilet   Toileting- Clothing Manipulation and Hygiene: Min guard;Sit to/from Nurse, children's Details (indicate cue type and reason): pt has tub shower at home Functional mobility during ADLs: Min guard General ADL Comments: educated pt on DME for bathroom (tub bench, 3 in 1)     Vision Vision Assessment?: No apparent visual deficits              Pertinent Vitals/Pain Pain Assessment: 0-10 Pain Score: 4  Pain Location: R knee Pain Descriptors / Indicators: Sore Pain Intervention(s): Monitored during session;Premedicated before session;Ice applied;Repositioned     Hand Dominance Right   Extremity/Trunk Assessment Upper Extremity Assessment Upper Extremity Assessment: Overall WFL for tasks assessed   Lower Extremity Assessment Lower Extremity Assessment: Defer to PT evaluation RLE Deficits / Details: hip flexion and knee extension 3/5; ankle WFL   Cervical / Trunk Assessment Cervical / Trunk Assessment: Normal   Communication Communication Communication: No difficulties   Cognition Arousal/Alertness: Awake/alert Behavior During Therapy: WFL for tasks assessed/performed Overall Cognitive Status: Within Functional Limits for tasks assessed                     General Comments   Pt very pleasant and cooperative                 Home Living Family/patient expects to be discharged to:: Private residence Living Arrangements: Spouse/significant other Available Help at Discharge: Family Type of Home: [place]  Access: Stairs to enter [employer] of Steps: 4 Entrance Stairs-Rails: None Home Layout: One level     Bathroom Shower/Tub: Teacher, early years/pre: Standard     Home Equipment: Environmental consultant - 2 wheels;Bedside commode;Shower seat;Crutches   Additional Comments: borrowed RW       Prior Functioning/Environment Level of  Independence: Independent                 OT Problem List: Decreased activity tolerance;Decreased knowledge of use of DME or AE;Impaired balance (sitting and/or standing);Pain   OT Treatment/Interventions: Self-care/ADL training;DME and/or AE instruction;Therapeutic activities;Patient/family education    OT Goals(Current goals can be found in the care plan section) Acute Rehab OT Goals Patient Stated Goal: back to Independence OT Goal Formulation: With patient Time For Goal Achievement: 01/04/17 Potential to Achieve Goals: Good ADL Goals Pt Will Perform Grooming: with set-up;standing Pt Will Perform Lower Body Bathing: with min guard assist;with caregiver independent in assisting Pt Will Perform Lower Body Dressing: with min guard assist;with caregiver independent in assisting Pt Will Transfer to Toilet: with supervision;with modified independence;ambulating Pt Will Perform Toileting - Clothing Manipulation and hygiene: with supervision;with modified independence;sit to/from stand Pt Will Perform Tub/Shower Transfer: 3 in 1;ambulating;with caregiver independent in assisting;with min guard assist;with supervision;rolling walker;tub bench  OT Frequency: Min 2X/week   Barriers to D/C:    no barriers                     End of Session Equipment Utilized During Treatment: Rolling walker;Other (comment) (3 in 1) CPM Right Knee CPM Right Knee: Off  Activity Tolerance: Patient tolerated treatment well Patient left: in chair;with call bell/phone within reach   Time: [id number] OT Time Calculation (min): 26 min Charges:  OT General Charges $OT Visit: 1 Procedure OT Evaluation $OT Eval Moderate Complexity: 1 Procedure OT Treatments $Therapeutic Activity: 8-22 mins G-Codes:    Britt Bottom 12/28/2016, 11:48 AM

## 2016-12-28 NOTE — Care Management Note (Signed)
Case Management Note  Patient Details  Name: NOWELL SITES MRN: [id number] Date of Birth: May 15, 1956  Subjective/Objective:                    Action/Plan:   Expected Discharge Date:  12/29/16               Expected Discharge Plan:  [place]  In-House Referral:     Discharge planning Services  CM Consult  Post Acute Care Choice:  Home Health Choice offered to:  Patient  DME Arranged:  N/A DME Agency:  NA  HH Arranged:  PT [place] Agency:  [facility] (formerly [facility])  Status of Service:  Completed, signed off  If discussed at [employer], dates discussed:    Additional Comments: CM met with pt in room to offer choice of home health agency. Pt chooses [facility] to render HHPT.  Referral given to [facility] rep, Tim.  Pt states he has all DME needed at home. NO other CM needs were communicated. Dellie Catholic, RN 12/28/2016, 10:33 AM

## 2016-12-29 LAB — CBC
HCT: 37.3 % — ABNORMAL LOW (ref 39.0–52.0)
Hemoglobin: 12.9 g/dL — ABNORMAL LOW (ref 13.0–17.0)
MCH: 30.6 pg (ref 26.0–34.0)
MCHC: 34.6 g/dL (ref 30.0–36.0)
MCV: 88.6 fL (ref 78.0–100.0)
Platelets: 268 10*3/uL (ref 150–400)
RBC: 4.21 MIL/uL — ABNORMAL LOW (ref 4.22–5.81)
RDW: 12.7 % (ref 11.5–15.5)
WBC: 20.6 10*3/uL — ABNORMAL HIGH (ref 4.0–10.5)

## 2016-12-29 LAB — BASIC METABOLIC PANEL
Anion gap: 4 — ABNORMAL LOW (ref 5–15)
BUN: 18 mg/dL (ref 6–20)
CO2: 25 mmol/L (ref 22–32)
Calcium: 8.3 mg/dL — ABNORMAL LOW (ref 8.9–10.3)
Chloride: 107 mmol/L (ref 101–111)
Creatinine, Ser: 0.71 mg/dL (ref 0.61–1.24)
GFR calc Af Amer: >60 mL/min (ref >60)
GFR calc non Af Amer: >60 mL/min (ref >60)
Glucose, Bld: 134 mg/dL — ABNORMAL HIGH (ref 65–99)
Potassium: 4.2 mmol/L (ref 3.5–5.1)
Sodium: 136 mmol/L (ref 135–145)

## 2016-12-29 MED ORDER — RIVAROXABAN 10 MG PO TABS: 10.0000 mg | ORAL_TABLET | Freq: Every day | ORAL | 0 refills | Status: DC

## 2016-12-29 MED ORDER — TRAMADOL HCL 50 MG PO TABS: 50.0000 mg | ORAL_TABLET | Freq: Four times a day (QID) | ORAL | 0 refills | Status: DC | PRN

## 2016-12-29 MED ORDER — OXYCODONE HCL 5 MG PO TABS: 5.0000 mg | ORAL_TABLET | ORAL | Status: DC | PRN

## 2016-12-29 MED ORDER — OXYCODONE HCL 5 MG PO TABS: 5.0000 mg | ORAL_TABLET | ORAL | 0 refills | Status: DC | PRN

## 2016-12-29 MED ORDER — METHOCARBAMOL 500 MG PO TABS: 500.0000 mg | ORAL_TABLET | Freq: Four times a day (QID) | ORAL | 0 refills | Status: DC | PRN

## 2016-12-29 NOTE — Progress Notes (Signed)
Physical Therapy Treatment Patient Details Name: John Buck MRN: [id number] DOB: 06-13-1956 Today's Date: 12/29/2016    History of Present Illness s/p R TKA    PT Comments    Progressing well. Recommend that he wear KI into house, then can start walking without it.  Follow Up Recommendations  Home health PT     Equipment Recommendations  Crutches    Recommendations for Other Services       Precautions / Restrictions Precautions Precautions: Fall;Knee Precaution Comments: reviewed no pillow under knee Required Braces or Orthoses: Knee Immobilizer - Right Knee Immobilizer - Right: Discontinue once straight leg raise with < 10 degree lag    Mobility  Bed Mobility   Bed Mobility: Supine to Sit     Supine to sit: Supervision     General bed mobility comments: patient managed the right  leg  Transfers Overall transfer level: Needs assistance Equipment used: Rolling walker (2 wheeled) Transfers: Sit to/from Stand Sit to Stand: Supervision         General transfer comment: cues for hand placement and RLE position  Ambulation/Gait Ambulation/Gait assistance: Supervision Ambulation Distance (Feet): 100 Feet Assistive device: Rolling walker (2 wheeled);Crutches Gait Pattern/deviations: Step-to pattern;Antalgic     General Gait Details: cues for sequence and RW position, does well with crutches.   Stairs Stairs: Yes   Stair Management: Step to pattern;No rails;Forwards;With crutches Number of Stairs: 2 General stair comments: cues for sequence  Wheelchair Mobility    Modified Rankin (Stroke Patients Only)       Balance                                    Cognition Arousal/Alertness: Awake/alert Behavior During Therapy: WFL for tasks assessed/performed Overall Cognitive Status: Within Functional Limits for tasks assessed                      Exercises Total Joint Exercises Ankle Circles/Pumps: AROM;Both;10 reps Quad  Sets: AROM;Strengthening;Both;10 reps Towel Squeeze: AROM;Both;10 reps Heel Slides: AROM;AAROM;Right;10 reps;Supine Straight Leg Raises: AROM;20 reps;Right Goniometric ROM: ~8-65 right knee flexion    General Comments        Pertinent Vitals/Pain Pain Score: 2  Pain Location: R knee Pain Descriptors / Indicators: Sore Pain Intervention(s): Monitored during session;Premedicated before session    Home Living                      Prior Function            PT Goals (current goals can now be found in the care plan section) Progress towards PT goals: Progressing toward goals    Frequency           PT Plan Current plan remains appropriate    Co-evaluation             End of Session Equipment Utilized During Treatment: Right knee immobilizer Activity Tolerance: Patient tolerated treatment well Patient left: in chair;with call bell/phone within reach;with family/visitor present     Time: [id number] PT Time Calculation (min) (ACUTE ONLY): 48 min  Charges:  $Gait Training: 8-22 mins $Therapeutic Exercise: 8-22 mins $Self Care/Home Management: 8-22                    G Codes:      Claretha Cooper 12/29/2016, 1:29 PM Tresa Endo PT [phone]

## 2016-12-29 NOTE — Discharge Summary (Signed)
Physician Discharge Summary   Patient ID: John Buck MRN: [id number] DOB/AGE: [birth year] 61 y.o.  Admit date: 12/27/2016 Discharge date: 12/29/2016  Primary Diagnosis:  Osteoarthritis  Right knee(s) Admission Diagnoses:  Past Medical History:  Diagnosis Date  . Arthritis    osteoarthritis  . History of kidney stones   . Hypertension    pt denies   Discharge Diagnoses:   Principal Problem:   OA (osteoarthritis) of knee  Estimated body mass index is 31.88 kg/m as calculated from the following:   Height as of this encounter: 5' 9.5" (1.765 m).   Weight as of this encounter: 99.3 kg (219 lb).  Procedure:  Procedure(s) (LRB): RIGHT TOTAL KNEE ARTHROPLASTY (Right)   Consults: None  HPI: John Buck is a 61 y.o. year old male with end stage OA of his right knee with progressively worsening pain and dysfunction. He has constant pain, with activity and at rest and significant functional deficits with difficulties even with ADLs. He has had extensive non-op management including analgesics, injections of cortisone, and home exercise program, but remains in significant pain with significant dysfunction. Radiographs show bone on bone arthritis medial and patellofemoral. He presents now for right Total Knee Arthroplasty.   Laboratory Data: Admission on 12/27/2016  Component Date Value Ref Range Status  . WBC 12/28/2016 15.9* 4.0 - 10.5 K/uL Final  . RBC 12/28/2016 4.43  4.22 - 5.81 MIL/uL Final  . Hemoglobin 12/28/2016 13.6  13.0 - 17.0 g/dL Final  . HCT 12/28/2016 39.1  39.0 - 52.0 % Final  . MCV 12/28/2016 88.3  78.0 - 100.0 fL Final  . MCH 12/28/2016 30.7  26.0 - 34.0 pg Final  . MCHC 12/28/2016 34.8  30.0 - 36.0 g/dL Final  . RDW 12/28/2016 12.4  11.5 - 15.5 % Final  . Platelets 12/28/2016 250  150 - 400 K/uL Final  . Sodium 12/28/2016 135  135 - 145 mmol/L Final  . Potassium 12/28/2016 4.6  3.5 - 5.1 mmol/L Final  . Chloride 12/28/2016 107  101 - 111 mmol/L Final  . CO2  12/28/2016 22  22 - 32 mmol/L Final  . Glucose, Bld 12/28/2016 146* 65 - 99 mg/dL Final  . BUN 12/28/2016 21* 6 - 20 mg/dL Final  . Creatinine, Ser 12/28/2016 0.83  0.61 - 1.24 mg/dL Final  . Calcium 12/28/2016 8.0* 8.9 - 10.3 mg/dL Final  . GFR calc non Af Amer 12/28/2016 >60  >60 mL/min Final  . GFR calc Af Amer 12/28/2016 >60  >60 mL/min Final   Comment: (NOTE) The eGFR has been calculated using the CKD EPI equation. This calculation has not been validated in all clinical situations. eGFR's persistently <60 mL/min signify possible Chronic Kidney Disease.   . Anion gap 12/28/2016 6  5 - 15 Final  . WBC 12/29/2016 20.6* 4.0 - 10.5 K/uL Final  . RBC 12/29/2016 4.21* 4.22 - 5.81 MIL/uL Final  . Hemoglobin 12/29/2016 12.9* 13.0 - 17.0 g/dL Final  . HCT 12/29/2016 37.3* 39.0 - 52.0 % Final  . MCV 12/29/2016 88.6  78.0 - 100.0 fL Final  . MCH 12/29/2016 30.6  26.0 - 34.0 pg Final  . MCHC 12/29/2016 34.6  30.0 - 36.0 g/dL Final  . RDW 12/29/2016 12.7  11.5 - 15.5 % Final  . Platelets 12/29/2016 268  150 - 400 K/uL Final  . Sodium 12/29/2016 136  135 - 145 mmol/L Final  . Potassium 12/29/2016 4.2  3.5 - 5.1 mmol/L Final  . Chloride 12/29/2016  107  101 - 111 mmol/L Final  . CO2 12/29/2016 25  22 - 32 mmol/L Final  . Glucose, Bld 12/29/2016 134* 65 - 99 mg/dL Final  . BUN 12/29/2016 18  6 - 20 mg/dL Final  . Creatinine, Ser 12/29/2016 0.71  0.61 - 1.24 mg/dL Final  . Calcium 12/29/2016 8.3* 8.9 - 10.3 mg/dL Final  . GFR calc non Af Amer 12/29/2016 >60  >60 mL/min Final  . GFR calc Af Amer 12/29/2016 >60  >60 mL/min Final   Comment: (NOTE) The eGFR has been calculated using the CKD EPI equation. This calculation has not been validated in all clinical situations. eGFR's persistently <60 mL/min signify possible Chronic Kidney Disease.   Georgiann Hahn gap 12/29/2016 4* 5 - 15 Final  Hospital Outpatient Visit on 12/20/2016  Component Date Value Ref Range Status  . aPTT 12/20/2016 31  24 - 36  seconds Final  . WBC 12/20/2016 9.2  4.0 - 10.5 K/uL Final  . RBC 12/20/2016 4.82  4.22 - 5.81 MIL/uL Final  . Hemoglobin 12/20/2016 15.4  13.0 - 17.0 g/dL Final  . HCT 12/20/2016 43.5  39.0 - 52.0 % Final  . MCV 12/20/2016 90.2  78.0 - 100.0 fL Final  . MCH 12/20/2016 32.0  26.0 - 34.0 pg Final  . MCHC 12/20/2016 35.4  30.0 - 36.0 g/dL Final  . RDW 12/20/2016 12.5  11.5 - 15.5 % Final  . Platelets 12/20/2016 274  150 - 400 K/uL Final  . Sodium 12/20/2016 139  135 - 145 mmol/L Final  . Potassium 12/20/2016 4.8  3.5 - 5.1 mmol/L Final  . Chloride 12/20/2016 106  101 - 111 mmol/L Final  . CO2 12/20/2016 27  22 - 32 mmol/L Final  . Glucose, Bld 12/20/2016 83  65 - 99 mg/dL Final  . BUN 12/20/2016 17  6 - 20 mg/dL Final  . Creatinine, Ser 12/20/2016 0.89  0.61 - 1.24 mg/dL Final  . Calcium 12/20/2016 9.1  8.9 - 10.3 mg/dL Final  . Total Protein 12/20/2016 7.6  6.5 - 8.1 g/dL Final  . Albumin 12/20/2016 4.2  3.5 - 5.0 g/dL Final  . AST 12/20/2016 21  15 - 41 U/L Final  . ALT 12/20/2016 18  17 - 63 U/L Final  . Alkaline Phosphatase 12/20/2016 72  38 - 126 U/L Final  . Total Bilirubin 12/20/2016 0.5  0.3 - 1.2 mg/dL Final  . GFR calc non Af Amer 12/20/2016 >60  >60 mL/min Final  . GFR calc Af Amer 12/20/2016 >60  >60 mL/min Final   Comment: (NOTE) The eGFR has been calculated using the CKD EPI equation. This calculation has not been validated in all clinical situations. eGFR's persistently <60 mL/min signify possible Chronic Kidney Disease.   . Anion gap 12/20/2016 6  5 - 15 Final  . Prothrombin Time 12/20/2016 13.4  11.4 - 15.2 seconds Final  . INR 12/20/2016 1.02   Final  . ABO/RH(D) 12/20/2016 A NEG   Final  . Antibody Screen 12/20/2016 NEG   Final  . Sample Expiration 12/20/2016 12/30/2016   Final  . Extend sample reason 12/20/2016 NO TRANSFUSIONS OR PREGNANCY IN THE PAST 3 MONTHS   Final  . Color, Urine 12/20/2016 YELLOW  YELLOW Final  . APPearance 12/20/2016 CLEAR  CLEAR Final    . Specific Gravity, Urine 12/20/2016 1.020  1.005 - 1.030 Final  . pH 12/20/2016 6.0  5.0 - 8.0 Final  . Glucose, UA 12/20/2016 NEGATIVE  NEGATIVE mg/dL Final  .  Hgb urine dipstick 12/20/2016 NEGATIVE  NEGATIVE Final  . Bilirubin Urine 12/20/2016 NEGATIVE  NEGATIVE Final  . Ketones, ur 12/20/2016 NEGATIVE  NEGATIVE mg/dL Final  . Protein, ur 12/20/2016 NEGATIVE  NEGATIVE mg/dL Final  . Nitrite 12/20/2016 NEGATIVE  NEGATIVE Final  . Leukocytes, UA 12/20/2016 NEGATIVE  NEGATIVE Final  . MRSA, PCR 12/20/2016 NEGATIVE  NEGATIVE Final  . Staphylococcus aureus 12/20/2016 POSITIVE* NEGATIVE Final   Comment:        The Xpert SA Assay (FDA approved for NASAL specimens in patients over 29 years of age), is one component of a comprehensive surveillance program.  Test performance has been validated by [facility] for patients greater than or equal to 21 year old. It is not intended to diagnose infection nor to guide or monitor treatment.   . ABO/RH(D) 12/20/2016 A NEG   Final     X-Rays:No results found.  EKG: Orders placed or performed during the hospital encounter of 12/20/16  . EKG 12 lead  . EKG 12 lead     Hospital Course: JAVARRI SEGAL is a 61 y.o. who was admitted to [facility]. They were brought to the operating room on 12/27/2016 and underwent Procedure(s): RIGHT TOTAL KNEE ARTHROPLASTY.  Patient tolerated the procedure well and was later transferred to the recovery room and then to the orthopaedic floor for postoperative care.  They were given PO and IV analgesics for pain control following their surgery.  They were given 24 hours of postoperative antibiotics of  Anti-infectives    Start     Dose/Rate Route Frequency Ordered Stop   12/27/16 1930  ceFAZolin (ANCEF) IVPB 2g/100 mL premix     2 g 200 mL/hr over 30 Minutes Intravenous Every 6 hours 12/27/16 1721 12/28/16 0214   12/27/16 1031  ceFAZolin (ANCEF) IVPB 2g/100 mL premix     2 g 200 mL/hr over 30 Minutes  Intravenous On call to O.R. 12/27/16 1031 12/27/16 1326     and started on DVT prophylaxis in the form of Xarelto.   PT and OT were ordered for total joint protocol.  Discharge planning consulted to help with postop disposition and equipment needs.  Patient had a decent night on the evening of surgery.  They started to get up OOB with therapy on day one. Hemovac drain was pulled without difficulty.  Continued to work with therapy into day two.  Dressing was changed on day two and the incision was healing well.  Patient was seen in rounds and was ready to go home.   Diet: Cardiac diet Activity:WBAT Follow-up:in 2 weeks Disposition - Home Discharged Condition: good   Discharge Instructions    Call MD / Call 911    Complete by:  As directed    If you experience chest pain or shortness of breath, CALL 911 and be transported to the hospital emergency room.  If you develope a fever above 101 F, pus (white drainage) or increased drainage or redness at the wound, or calf pain, call your surgeon's office.   Change dressing    Complete by:  As directed    Change dressing daily with sterile 4 x 4 inch gauze dressing and apply TED hose. Do not submerge the incision under water.   Constipation Prevention    Complete by:  As directed    Drink plenty of fluids.  Prune juice may be helpful.  You may use a stool softener, such as Colace (over the counter) 100 mg twice a day.  Use MiraLax (over the counter) for constipation as needed.   Diet - low sodium heart healthy    Complete by:  As directed    Discharge instructions    Complete by:  As directed    Pick up stool softner and laxative for home use following surgery while on pain medications. Do not submerge incision under water. Please use good hand washing techniques while changing dressing each day. May shower starting three days after surgery. Please use a clean towel to pat the incision dry following showers. Continue to use ice for pain and  swelling after surgery. Do not use any lotions or creams on the incision until instructed by your surgeon.  Wear both TED hose on both legs during the day every day for three weeks, but may have off at night at home.  Postoperative Constipation Protocol  Constipation - defined medically as fewer than three stools per week and severe constipation as less than one stool per week.  One of the most common issues patients have following surgery is constipation.  Even if you have a regular bowel pattern at home, your normal regimen is likely to be disrupted due to multiple reasons following surgery.  Combination of anesthesia, postoperative narcotics, change in appetite and fluid intake all Buck affect your bowels.  In order to avoid complications following surgery, here are some recommendations in order to help you during your recovery period.  Colace (docusate) - Pick up an over-the-counter form of Colace or another stool softener and take twice a day as long as you are requiring postoperative pain medications.  Take with a full glass of water daily.  If you experience loose stools or diarrhea, hold the colace until you stool forms back up.  If your symptoms do not get better within 1 week or if they get worse, check with your doctor.  Dulcolax (bisacodyl) - Pick up over-the-counter and take as directed by the product packaging as needed to assist with the movement of your bowels.  Take with a full glass of water.  Use this product as needed if not relieved by Colace only.   MiraLax (polyethylene glycol) - Pick up over-the-counter to have on hand.  MiraLax is a solution that will increase the amount of water in your bowels to assist with bowel movements.  Take as directed and Buck mix with a glass of water, juice, soda, coffee, or tea.  Take if you go more than two days without a movement. Do not use MiraLax more than once per day. Call your doctor if you are still constipated or irregular after using this  medication for 7 days in a row.  If you continue to have problems with postoperative constipation, please contact the office for further assistance and recommendations.  If you experience "the worst abdominal pain ever" or develop nausea or vomiting, please contact the office immediatly for further recommendations for treatment.   Take Xarelto for two and a half more weeks, then discontinue Xarelto. Once the patient has completed the Xarelto, they may resume the 325 mg Aspirin.   Do not put a pillow under the knee. Place it under the heel.    Complete by:  As directed    Do not sit on low chairs, stoools or toilet seats, as it may be difficult to get up from low surfaces    Complete by:  As directed    Driving restrictions    Complete by:  As directed    No driving  until released by the physician.   Increase activity slowly as tolerated    Complete by:  As directed    Lifting restrictions    Complete by:  As directed    No lifting until released by the physician.   Patient may shower    Complete by:  As directed    You may shower without a dressing once there is no drainage.  Do not wash over the wound.  If drainage remains, do not shower until drainage stops.   TED hose    Complete by:  As directed    Use stockings (TED hose) for 3 weeks on both leg(s).  You may remove them at night for sleeping.   Weight bearing as tolerated    Complete by:  As directed    Laterality:  right   Extremity:  Lower     Allergies as of 12/29/2016      Reactions   Butorphanol Tartrate    REACTION: Hallucinations   Hydromorphone Hcl    REACTION: itching      Medication List    STOP taking these medications   aspirin 325 MG tablet   GLUCOSAMINE CHONDR COMPLEX PO   ibuprofen 200 MG tablet Commonly known as:  ADVIL,MOTRIN   PROBIOTIC DAILY PO     TAKE these medications   acetaminophen 500 MG tablet Commonly known as:  TYLENOL Take 500 mg by mouth as needed.   furosemide 20 MG  tablet Commonly known as:  LASIX Take 1 tablet (20 mg total) by mouth daily.   hydrochlorothiazide 25 MG tablet Commonly known as:  HYDRODIURIL TAKE 1/2 TABLET EVERY DAY What changed:  See the new instructions.   hydrocortisone 2.5 % rectal cream Commonly known as:  PROCTOSOL HC INSERT 1 APPLICATION PER RECTUM DAILY AT BEDTIME AS NEEDED What changed:  how much to take  how to take this  when to take this  reasons to take this  additional instructions   methocarbamol 500 MG tablet Commonly known as:  ROBAXIN Take 1 tablet (500 mg total) by mouth every 6 (six) hours as needed for muscle spasms.   oxyCODONE 5 MG immediate release tablet Commonly known as:  Oxy IR/ROXICODONE Take 1-2 tablets (5-10 mg total) by mouth every 4 (four) hours as needed for moderate pain or severe pain.   rivaroxaban 10 MG Tabs tablet Commonly known as:  XARELTO Take 1 tablet (10 mg total) by mouth daily with breakfast. Take Xarelto for two and a half more weeks following discharge from the hospital, then discontinue Xarelto. Once the patient has completed the Xarelto, they may resume the 325 mg Aspirin. Start taking on:  12/30/2016   traMADol 50 MG tablet Commonly known as:  ULTRAM Take 1-2 tablets (50-100 mg total) by mouth every 6 (six) hours as needed (mild pain).      Follow-up Information    [facility] Follow up.   Specialty:  Home Health Services Why:  home health physical therapy Contact information: [address] [place] [address] [phone]        Gearlean Alf, MD. Schedule an appointment as soon as possible for a visit on 01/11/2017.   Specialty:  Orthopedic Surgery Contact information: [address] [place] [address] [phone]           Signed: Arlee Muslim, PA-C Orthopaedic Surgery 12/29/2016, 9:58 AM

## 2016-12-29 NOTE — Progress Notes (Signed)
   Subjective: 2 Days Post-Op Procedure(s) (LRB): RIGHT TOTAL KNEE ARTHROPLASTY (Right) Patient reports pain as mild.   Patient seen in rounds for Dr. Wynelle Link. Doing better Patient is well, but has had some minor complaints of pain in the knee, requiring pain medications Patient is ready to go home  Objective: Vital signs in last 24 hours: Temp:  [97.7 F (36.5 C)-98 F (36.7 C)] 97.7 F (36.5 C) (01/24 0543) Pulse Rate:  [78-96] 82 (01/24 0800) Resp:  [19] 19 (01/24 0543) BP: (131-149)/(76-95) 139/79 (01/24 0800) SpO2:  [95 %-97 %] 95 % (01/24 0543)  Intake/Output from previous day:  Intake/Output Summary (Last 24 hours) at 12/29/16 0954 Last data filed at 12/29/16 0954  Gross per 24 hour  Intake             1140 ml  Output             1810 ml  Net             -670 ml    Intake/Output this shift: Total I/O In: 240 [P.O.:240] Out: 300 [Urine:300]  Labs:  Recent Labs  12/28/16 0425 12/29/16 0420  HGB 13.6 12.9*    Recent Labs  12/28/16 0425 12/29/16 0420  WBC 15.9* 20.6*  RBC 4.43 4.21*  HCT 39.1 37.3*  PLT 250 268    Recent Labs  12/28/16 0425 12/29/16 0420  NA 135 136  K 4.6 4.2  CL 107 107  CO2 22 25  BUN 21* 18  CREATININE 0.83 0.71  GLUCOSE 146* 134*  CALCIUM 8.0* 8.3*   No results for input(s): LABPT, INR in the last 72 hours.  EXAM: General - Patient is Alert and Appropriate Extremity - Neurovascular intact Sensation intact distally Intact pulses distally Dorsiflexion/Plantar flexion intact Incision - clean, dry, no drainage Motor Function - intact, moving foot and toes well on exam.   Assessment/Plan: 2 Days Post-Op Procedure(s) (LRB): RIGHT TOTAL KNEE ARTHROPLASTY (Right) Procedure(s) (LRB): RIGHT TOTAL KNEE ARTHROPLASTY (Right) Past Medical History:  Diagnosis Date  . Arthritis    osteoarthritis  . History of kidney stones   . Hypertension    pt denies   Principal Problem:   OA (osteoarthritis) of knee  Estimated  body mass index is 31.88 kg/m as calculated from the following:   Height as of this encounter: 5' 9.5" (1.765 m).   Weight as of this encounter: 99.3 kg (219 lb). Up with therapy Discharge home with home health Diet - Cardiac diet Follow up - in 2 weeks Activity - WBAT Disposition - Home Condition Upon Discharge - Good D/C Meds - See DC Summary DVT Prophylaxis - Xarelto  Arlee Muslim, PA-C Orthopaedic Surgery 12/29/2016, 9:54 AM

## 2016-12-29 NOTE — Progress Notes (Signed)
LATE ENTRY NOTE Date of Service of Visit - 12/29/2015    Subjective: 1 Days Post-Op Procedure(s) (LRB): RIGHT TOTAL KNEE ARTHROPLASTY (Right) Patient reports pain as mild.   Patient seen in rounds with Dr. Wynelle Link. Patient is well, but has had some minor complaints of pain in the knee, requiring pain medications We will start therapy today.  Plan is to go Home after hospital stay.  Objective: Vital signs in last 24 hours: Temp:  [97.7 F (36.5 C)-98 F (36.7 C)] 97.7 F (36.5 C) (01/24 0543) Pulse Rate:  [78-96] 82 (01/24 0800) Resp:  [19] 19 (01/24 0543) BP: (131-149)/(76-95) 139/79 (01/24 0800) SpO2:  [95 %-97 %] 95 % (01/24 0543)  Intake/Output from previous day:  Intake/Output Summary (Last 24 hours) at 12/29/16 0954 Last data filed at 12/29/16 0820  Gross per 24 hour  Intake             1140 ml  Output             1510 ml  Net             -370 ml    Intake/Output this shift: Total I/O In: 240 [P.O.:240] Out: -   Labs:  Recent Labs  12/28/16 0425 12/29/16 0420  HGB 13.6 12.9*    Recent Labs  12/28/16 0425 12/29/16 0420  WBC 15.9* 20.6*  RBC 4.43 4.21*  HCT 39.1 37.3*  PLT 250 268    Recent Labs  12/28/16 0425 12/29/16 0420  NA 135 136  K 4.6 4.2  CL 107 107  CO2 22 25  BUN 21* 18  CREATININE 0.83 0.71  GLUCOSE 146* 134*  CALCIUM 8.0* 8.3*   No results for input(s): LABPT, INR in the last 72 hours.  EXAM General - Patient is Alert and Appropriate Extremity - Neurovascular intact Sensation intact distally Intact pulses distally Dorsiflexion/Plantar flexion intact Dressing - dressing C/D/I Motor Function - intact, moving foot and toes well on exam.  Hemovac pulled without difficulty.  Past Medical History:  Diagnosis Date  . Arthritis    osteoarthritis  . History of kidney stones   . Hypertension    pt denies    Assessment/Plan: 1 Days Post-Op Procedure(s) (LRB): RIGHT TOTAL KNEE ARTHROPLASTY (Right) Principal Problem:  OA (osteoarthritis) of knee  Estimated body mass index is 31.88 kg/m as calculated from the following:   Height as of this encounter: 5' 9.5" (1.765 m).   Weight as of this encounter: 99.3 kg (219 lb). Up with therapy  DVT Prophylaxis - Xarelto Weight-Bearing as tolerated to right leg D/C O2 and Pulse OX and try on Room Air  Arlee Muslim, PA-C Orthopaedic Surgery 12/29/2016, 9:54 AM

## 2016-12-29 NOTE — Discharge Instructions (Signed)
° °Dr. Frank Aluisio °Total Joint Specialist °[facility] °[address] °[place], [place] [address] °[phone] ° °TOTAL KNEE REPLACEMENT POSTOPERATIVE DIRECTIONS ° °Knee Rehabilitation, Guidelines Following Surgery  °Results after knee surgery are often greatly improved when you follow the exercise, range of motion and muscle strengthening exercises prescribed by your doctor. Safety measures are also important to protect the knee from further injury. Any time any of these exercises cause you to have increased pain or swelling in your knee joint, decrease the amount until you are comfortable again and slowly increase them. If you have problems or questions, call your caregiver or physical therapist for advice.  ° °HOME CARE INSTRUCTIONS  °Remove items at home which could result in a fall. This includes throw rugs or furniture in walking pathways.  °· ICE to the affected knee every three hours for 30 minutes at a time and then as needed for pain and swelling.  Continue to use ice on the knee for pain and swelling from surgery. You may notice swelling that will progress down to the foot and ankle.  This is normal after surgery.  Elevate the leg when you are not up walking on it.   °· Continue to use the breathing machine which will help keep your temperature down.  It is common for your temperature to cycle up and down following surgery, especially at night when you are not up moving around and exerting yourself.  The breathing machine keeps your lungs expanded and your temperature down. °· Do not place pillow under knee, focus on keeping the knee straight while resting ° °DIET °You may resume your previous home diet once your are discharged from the hospital. ° °DRESSING / WOUND CARE / SHOWERING °You may shower 3 days after surgery, but keep the wounds dry during showering.  You may use an occlusive plastic wrap (Press'n Seal for example), NO SOAKING/SUBMERGING IN THE BATHTUB.  If the  bandage gets wet, change with a clean dry gauze.  If the incision gets wet, pat the wound dry with a clean towel. °You may start showering once you are discharged home but do not submerge the incision under water. Just pat the incision dry and apply a dry gauze dressing on daily. °Change the surgical dressing daily and reapply a dry dressing each time. ° °ACTIVITY °Walk with your walker as instructed. °Use walker as long as suggested by your caregivers. °Avoid periods of inactivity such as sitting longer than an hour when not asleep. This helps prevent blood clots.  °You may resume a sexual relationship in one month or when given the OK by your doctor.  °You may return to work once you are cleared by your doctor.  °Do not drive a car for 6 weeks or until released by you surgeon.  °Do not drive while taking narcotics. ° °WEIGHT BEARING °Weight bearing as tolerated with assist device (walker, cane, etc) as directed, use it as long as suggested by your surgeon or therapist, typically at least 4-6 weeks. ° °POSTOPERATIVE CONSTIPATION PROTOCOL °Constipation - defined medically as fewer than three stools per week and severe constipation as less than one stool per week. ° °One of the most common issues patients have following surgery is constipation.  Even if you have a regular bowel pattern at home, your normal regimen is likely to be disrupted due to multiple reasons following surgery.  Combination of anesthesia, postoperative narcotics, change in appetite and fluid intake all can affect your bowels.    In order to avoid complications following surgery, here are some recommendations in order to help you during your recovery period. ° °Colace (docusate) - Pick up an over-the-counter form of Colace or another stool softener and take twice a day as long as you are requiring postoperative pain medications.  Take with a full glass of water daily.  If you experience loose stools or diarrhea, hold the colace until you stool forms  back up.  If your symptoms do not get better within 1 week or if they get worse, check with your doctor. ° °Dulcolax (bisacodyl) - Pick up over-the-counter and take as directed by the product packaging as needed to assist with the movement of your bowels.  Take with a full glass of water.  Use this product as needed if not relieved by Colace only.  ° °MiraLax (polyethylene glycol) - Pick up over-the-counter to have on hand.  MiraLax is a solution that will increase the amount of water in your bowels to assist with bowel movements.  Take as directed and can mix with a glass of water, juice, soda, coffee, or tea.  Take if you go more than two days without a movement. °Do not use MiraLax more than once per day. Call your doctor if you are still constipated or irregular after using this medication for 7 days in a row. ° °If you continue to have problems with postoperative constipation, please contact the office for further assistance and recommendations.  If you experience "the worst abdominal pain ever" or develop nausea or vomiting, please contact the office immediatly for further recommendations for treatment. ° °ITCHING ° If you experience itching with your medications, try taking only a single pain pill, or even half a pain pill at a time.  You can also use Benadryl over the counter for itching or also to help with sleep.  ° °TED HOSE STOCKINGS °Wear the elastic stockings on both legs for three weeks following surgery during the day but you may remove then at night for sleeping. ° °MEDICATIONS °See your medication summary on the “After Visit Summary” that the nursing staff will review with you prior to discharge.  You may have some home medications which will be placed on hold until you complete the course of blood thinner medication.  It is important for you to complete the blood thinner medication as prescribed by your surgeon.  Continue your approved medications as instructed at time of  discharge. ° °PRECAUTIONS °If you experience chest pain or shortness of breath - call 911 immediately for transfer to the hospital emergency department.  °If you develop a fever greater that 101 F, purulent drainage from wound, increased redness or drainage from wound, foul odor from the wound/dressing, or calf pain - CONTACT YOUR SURGEON.   °                                                °FOLLOW-UP APPOINTMENTS °Make sure you keep all of your appointments after your operation with your surgeon and caregivers. You should call the office at the above phone number and make an appointment for approximately two weeks after the date of your surgery or on the date instructed by your surgeon outlined in the "After Visit Summary". ° ° °RANGE OF MOTION AND STRENGTHENING EXERCISES  °Rehabilitation of the knee is important following a knee injury or   an operation. After just a few days of immobilization, the muscles of the thigh which control the knee become weakened and shrink (atrophy). Knee exercises are designed to build up the tone and strength of the thigh muscles and to improve knee motion. Often times heat used for twenty to thirty minutes before working out will loosen up your tissues and help with improving the range of motion but do not use heat for the first two weeks following surgery. These exercises can be done on a training (exercise) mat, on the floor, on a table or on a bed. Use what ever works the best and is most comfortable for you Knee exercises include:  Leg Lifts - While your knee is still immobilized in a splint or cast, you can do straight leg raises. Lift the leg to 60 degrees, hold for 3 sec, and slowly lower the leg. Repeat 10-20 times 2-3 times daily. Perform this exercise against resistance later as your knee gets better.  Quad and Hamstring Sets - Tighten up the muscle on the front of the thigh (Quad) and hold for 5-10 sec. Repeat this 10-20 times hourly. Hamstring sets are done by pushing the  foot backward against an object and holding for 5-10 sec. Repeat as with quad sets.   Leg Slides: Lying on your back, slowly slide your foot toward your buttocks, bending your knee up off the floor (only go as far as is comfortable). Then slowly slide your foot back down until your leg is flat on the floor again.  Angel Wings: Lying on your back spread your legs to the side as far apart as you can without causing discomfort.  A rehabilitation program following serious knee injuries can speed recovery and prevent re-injury in the future due to weakened muscles. Contact your doctor or a physical therapist for more information on knee rehabilitation.   IF YOU ARE TRANSFERRED TO A SKILLED REHAB FACILITY If the patient is transferred to a skilled rehab facility following release from the hospital, a list of the current medications will be sent to the facility for the patient to continue.  When discharged from the skilled rehab facility, please have the facility set up the patient's [place] prior to being released. Also, the skilled facility will be responsible for providing the patient with their medications at time of release from the facility to include their pain medication, the muscle relaxants, and their blood thinner medication. If the patient is still at the rehab facility at time of the two week follow up appointment, the skilled rehab facility will also need to assist the patient in arranging follow up appointment in our office and any transportation needs.  MAKE SURE YOU:  Understand these instructions.  Get help right away if you are not doing well or get worse.    Pick up stool softner and laxative for home use following surgery while on pain medications. Do not submerge incision under water. Please use good hand washing techniques while changing dressing each day. May shower starting three days after surgery. Please use a clean towel to pat the incision dry following  showers. Continue to use ice for pain and swelling after surgery. Do not use any lotions or creams on the incision until instructed by your surgeon.  Take Xarelto for two and a half more weeks following discharge from the hospital, then discontinue Xarelto. Once the patient has completed the Xarelto, they may resume the 325 mg Aspirin.    Information on  my medicine - XARELTO (Rivaroxaban)  This medication education was reviewed with me or my healthcare representative as part of my discharge preparation.  The pharmacist that spoke with me during my hospital stay was:  [facility] Askar, Student-PharmD  Why was Xarelto prescribed for you? Xarelto was prescribed for you to reduce the risk of blood clots forming after orthopedic surgery. The medical term for these abnormal blood clots is venous thromboembolism (VTE).  What do you need to know about xarelto ? Take your Xarelto ONCE DAILY at the same time every day. You may take it either with or without food.  If you have difficulty swallowing the tablet whole, you may crush it and mix in applesauce just prior to taking your dose.  Take Xarelto exactly as prescribed by your doctor and DO NOT stop taking Xarelto without talking to the doctor who prescribed the medication.  Stopping without other VTE prevention medication to take the place of Xarelto may increase your risk of developing a clot.  After discharge, you should have regular check-up appointments with your healthcare provider that is prescribing your Xarelto.    What do you do if you miss a dose? If you miss a dose, take it as soon as you remember on the same day then continue your regularly scheduled once daily regimen the next day. Do not take two doses of Xarelto on the same day.   Important Safety Information A possible side effect of Xarelto is bleeding. You should call your healthcare provider right away if you experience any of the following: ? Bleeding from an injury  or your nose that does not stop. ? Unusual colored urine (red or dark brown) or unusual colored stools (red or black). ? Unusual bruising for unknown reasons. ? A serious fall or if you hit your head (even if there is no bleeding).  Some medicines may interact with Xarelto and might increase your risk of bleeding while on Xarelto. To help avoid this, consult your healthcare provider or pharmacist prior to using any new prescription or non-prescription medications, including herbals, vitamins, non-steroidal anti-inflammatory drugs (NSAIDs) and supplements.  This website has more information on Xarelto: [email or web address]

## 2016-12-30 DIAGNOSIS — Z471 Aftercare following joint replacement surgery: Secondary | ICD-10-CM | POA: Diagnosis not present

## 2016-12-30 DIAGNOSIS — Z96651 Presence of right artificial knee joint: Secondary | ICD-10-CM | POA: Diagnosis not present

## 2016-12-30 DIAGNOSIS — I10 Essential (primary) hypertension: Secondary | ICD-10-CM | POA: Diagnosis not present

## 2016-12-31 DIAGNOSIS — Z96651 Presence of right artificial knee joint: Secondary | ICD-10-CM | POA: Diagnosis not present

## 2016-12-31 DIAGNOSIS — I10 Essential (primary) hypertension: Secondary | ICD-10-CM | POA: Diagnosis not present

## 2016-12-31 DIAGNOSIS — Z471 Aftercare following joint replacement surgery: Secondary | ICD-10-CM | POA: Diagnosis not present

## 2017-01-03 DIAGNOSIS — Z96651 Presence of right artificial knee joint: Secondary | ICD-10-CM | POA: Diagnosis not present

## 2017-01-03 DIAGNOSIS — I10 Essential (primary) hypertension: Secondary | ICD-10-CM | POA: Diagnosis not present

## 2017-01-03 DIAGNOSIS — Z471 Aftercare following joint replacement surgery: Secondary | ICD-10-CM | POA: Diagnosis not present

## 2017-01-05 DIAGNOSIS — Z96651 Presence of right artificial knee joint: Secondary | ICD-10-CM | POA: Diagnosis not present

## 2017-01-05 DIAGNOSIS — Z471 Aftercare following joint replacement surgery: Secondary | ICD-10-CM | POA: Diagnosis not present

## 2017-01-05 DIAGNOSIS — I10 Essential (primary) hypertension: Secondary | ICD-10-CM | POA: Diagnosis not present

## 2017-01-07 DIAGNOSIS — Z471 Aftercare following joint replacement surgery: Secondary | ICD-10-CM | POA: Diagnosis not present

## 2017-01-07 DIAGNOSIS — Z96651 Presence of right artificial knee joint: Secondary | ICD-10-CM | POA: Diagnosis not present

## 2017-01-07 DIAGNOSIS — I10 Essential (primary) hypertension: Secondary | ICD-10-CM | POA: Diagnosis not present

## 2017-01-10 DIAGNOSIS — M1711 Unilateral primary osteoarthritis, right knee: Secondary | ICD-10-CM | POA: Diagnosis not present

## 2017-01-14 DIAGNOSIS — M1711 Unilateral primary osteoarthritis, right knee: Secondary | ICD-10-CM | POA: Diagnosis not present

## 2017-01-19 DIAGNOSIS — M1711 Unilateral primary osteoarthritis, right knee: Secondary | ICD-10-CM | POA: Diagnosis not present

## 2017-01-21 ENCOUNTER — Emergency Department (HOSPITAL_COMMUNITY)
Admission: EM | Admit: 2017-01-21 | Discharge: 2017-01-21 | Disposition: A | Discharge status: Home / Self Care | Payer: 59 | Attending: Emergency Medicine | Admitting: Emergency Medicine

## 2017-01-21 ENCOUNTER — Encounter (HOSPITAL_COMMUNITY): Payer: Self-pay

## 2017-01-21 DIAGNOSIS — Z96651 Presence of right artificial knee joint: Secondary | ICD-10-CM | POA: Diagnosis not present

## 2017-01-21 DIAGNOSIS — Z79899 Other long term (current) drug therapy: Secondary | ICD-10-CM | POA: Diagnosis not present

## 2017-01-21 DIAGNOSIS — I10 Essential (primary) hypertension: Secondary | ICD-10-CM | POA: Insufficient documentation

## 2017-01-21 DIAGNOSIS — G8918 Other acute postprocedural pain: Secondary | ICD-10-CM

## 2017-01-21 DIAGNOSIS — F419 Anxiety disorder, unspecified: Secondary | ICD-10-CM

## 2017-01-21 DIAGNOSIS — R109 Unspecified abdominal pain: Secondary | ICD-10-CM | POA: Insufficient documentation

## 2017-01-21 LAB — URINALYSIS, ROUTINE W REFLEX MICROSCOPIC
Bacteria, UA: NONE SEEN
Bilirubin Urine: NEGATIVE
Glucose, UA: NEGATIVE mg/dL
Ketones, ur: NEGATIVE mg/dL
Nitrite: NEGATIVE
Protein, ur: NEGATIVE mg/dL
Specific Gravity, Urine: 1.014 (ref 1.005–1.030)
Squamous Epithelial / LPF: NONE SEEN
pH: 6 (ref 5.0–8.0)

## 2017-01-21 MED ORDER — POLYETHYLENE GLYCOL 3350 17 G PO PACK: 17.0000 g | PACK | Freq: Every day | ORAL | 0 refills | Status: DC

## 2017-01-21 MED ORDER — ONDANSETRON 8 MG PO TBDP: 8.0000 mg | ORAL_TABLET | Freq: Three times a day (TID) | ORAL | 0 refills | Status: DC | PRN

## 2017-01-21 MED ORDER — ONDANSETRON 4 MG PO TBDP
8.0000 mg | ORAL_TABLET | Freq: Once | ORAL | Status: AC
  Administered 2017-01-21: 8 mg via ORAL
  Filled 2017-01-21: qty 2

## 2017-01-21 NOTE — ED Provider Notes (Signed)
[facility] DEPT Provider Note   CSN: [id number] Arrival date & time: 01/21/17  [id number]  By signing my name below, I, Arianna Nassar, attest that this documentation has been prepared under the direction and in the presence of Ripley Fraise, MD.  Electronically Signed: Julien Nordmann, ED Scribe. 01/21/17. 4:11 AM.    History   Chief Complaint Chief Complaint  Patient presents with  . Panic Attack  . Flank Pain    The history is provided by the patient. No language interpreter was used.   HPI Comments: John Buck is a 61 y.o. male who presents to the Emergency Department presenting with multiple complaints. Pt had a total right knee arthroplasty on 12/27/16 performed by Dr. Gaynelle Arabian. He was prescribed 5-10 mg oxycodone to take q4h or prn as needed for pain. He expresses that he has been taking oxycodone every day for his pain since the surgery. Pt says about one week after his surgery, he began to have constipation. He notes that is constipation alleviated after taking Dulcolax but notes it came back shortly after. Pt notes taking Dulcolax and hydrochlorothiazide this evening to alleviate his constipation due to passing small amounts of bowel but it did not give him any relief. He states he also been having difficulty sleeping at night, stating that he only gets 1-2 hours of sleep each night. Pt notes he has been trying to ween himself off of the oxycodone and he is unsure if he is having withdrawal symptoms. His last dose of oxycodone was Wednesday night around 11 pm (~ 24 hours ago). Pt further states that he has has about 3 panic attacks this evening where he broke down and cried. He notes feeling like he couldn't breathe as well as having nausea. Pt expresses associated dysuria and low output of urine that started this morning and radiated to the right side of his back. He has a hx of kidney stones and notes this may be similar. Pt was on Xarelto until ~ 4 days ago, now on ASA. Pt  denies hx of MI and CVA. He further denies fever, vomiting, headache, chest pain, and weakness in arms or legs.  Past Medical History:  Diagnosis Date  . Arthritis    osteoarthritis  . History of kidney stones   . Hypertension    pt denies    Patient Active Problem List   Diagnosis Date Noted  . OA (osteoarthritis) of knee 12/27/2016  . Peripheral edema 02/29/2012  . Keratosis, inflamed seborrheic 05/04/2011  . HEARING LOSS, RIGHT EAR 03/16/2010  . PES PLANUS 04/29/2009  . HYPERTENSION 04/01/2008  . NEPHROLITHIASIS, HX OF 04/01/2008    Past Surgical History:  Procedure Laterality Date  . KNEE ARTHROSCOPY W/ ACL RECONSTRUCTION     x2  . TOTAL KNEE ARTHROPLASTY Right 12/27/2016   Procedure: RIGHT TOTAL KNEE ARTHROPLASTY;  Surgeon: Gaynelle Arabian, MD;  Location: [facility];  Service: Orthopedics;  Laterality: Right;       Home Medications    Prior to Admission medications   Medication Sig Start Date End Date Taking? Authorizing Provider  acetaminophen (TYLENOL) 500 MG tablet Take 500 mg by mouth as needed.    Historical Provider, MD  furosemide (LASIX) 20 MG tablet Take 1 tablet (20 mg total) by mouth daily. 04/10/12 04/10/13  Dorena Cookey, MD  hydrochlorothiazide (HYDRODIURIL) 25 MG tablet TAKE 1/2 TABLET EVERY DAY Patient taking differently: TAKE 1/2 TABLET (12.5 mg)EVERY DAY 11/24/15   Dorena Cookey, MD  hydrocortisone (PROCTOSOL  HC) 2.5 % rectal cream INSERT 1 APPLICATION PER RECTUM DAILY AT BEDTIME AS NEEDED Patient taking differently: Place 1 application rectally as needed for hemorrhoids. INSERT 1 APPLICATION PER RECTUM DAILY AT BEDTIME AS NEEDED 03/18/14   Dorena Cookey, MD  methocarbamol (ROBAXIN) 500 MG tablet Take 1 tablet (500 mg total) by mouth every 6 (six) hours as needed for muscle spasms. 12/29/16   Alexzandrew L Perkins, PA-C  oxyCODONE (OXY IR/ROXICODONE) 5 MG immediate release tablet Take 1-2 tablets (5-10 mg total) by mouth every 4 (four) hours as needed for  moderate pain or severe pain. 12/29/16   Alexzandrew L Perkins, PA-C  rivaroxaban (XARELTO) 10 MG TABS tablet Take 1 tablet (10 mg total) by mouth daily with breakfast. Take Xarelto for two and a half more weeks following discharge from the hospital, then discontinue Xarelto. Once the patient has completed the Xarelto, they may resume the 325 mg Aspirin. 12/30/16   Alexzandrew L Perkins, PA-C  traMADol (ULTRAM) 50 MG tablet Take 1-2 tablets (50-100 mg total) by mouth every 6 (six) hours as needed (mild pain). 12/29/16   Alexzandrew Monika Salk, PA-C    Family History Family History  Problem Relation Age of Onset  . Hypertension Mother   . Colon cancer Father 59  . Skin cancer Father   . Seizures Father   . Skin cancer Brother   . Hypertension Brother     Social History Social History  Substance Use Topics  . Smoking status: Never Smoker  . Smokeless tobacco: Never Used  . Alcohol use No     Allergies   Butorphanol tartrate and Hydromorphone hcl   Review of Systems Review of Systems  Constitutional: Negative for fever.  Respiratory: Positive for shortness of breath.   Cardiovascular: Negative for chest pain.  Gastrointestinal: Positive for nausea. Negative for abdominal pain, diarrhea and vomiting.  Genitourinary: Positive for difficulty urinating, dysuria and flank pain.  Musculoskeletal: Positive for back pain.  Neurological: Negative for headaches.  All other systems reviewed and are negative.    Physical Exam Updated Vital Signs BP 145/91 (BP Location: Left Arm)   Pulse (!) 136   Temp 98.1 F (36.7 C) (Oral)   Resp 24   SpO2 100%   Physical Exam   CONSTITUTIONAL: Well developed/well nourished HEAD: Normocephalic/atraumatic EYES: EOMI/PERRL ENMT: Mucous membranes moist NECK: supple no meningeal signs SPINE/BACK:entire spine nontender CV: S1/S2 noted, no murmurs/rubs/gallops noted LUNGS: Lungs are clear to auscultation bilaterally, no apparent  distress ABDOMEN: soft, nontender, no rebound or guarding, bowel sounds noted throughout abdomen GU:no cva tenderness. No scrotal tenderness noted; wife at bedside per pt request RECTAL: no stool impaction noted, no mass noted. Stool color brown. NEURO: Pt is awake/alert/appropriate, moves all extremitiesx4.  No facial droop.  Pt ambulatory.   EXTREMITIES: pulses normal/equal, full ROM, no calf tenderness/edema, well healing incision to right knee, no erythema/drainage SKIN: warm, color normal PSYCH: no abnormalities of mood noted, alert and oriented to situation; mildly anxious     ED Treatments / Results  DIAGNOSTIC STUDIES: Oxygen Saturation is 100% on RA, normal by my interpretation.  COORDINATION OF CARE:  3:26 AM Discussed treatment plan with pt at bedside and pt agreed to plan.  Labs (all labs ordered are listed, but only abnormal results are displayed) Labs Reviewed  URINALYSIS, ROUTINE W REFLEX MICROSCOPIC - Abnormal; Notable for the following:       Result Value   Hgb urine dipstick LARGE (*)    Leukocytes, UA TRACE (*)  All other components within normal limits    EKG  EKG Interpretation  Date/Time:  Friday January 21 2017 03:50:22 EST Ventricular Rate:  73 PR Interval:    QRS Duration: 99 QT Interval:  395 QTC Calculation: 436 R Axis:   28 Text Interpretation:  Sinus rhythm Abnormal R-wave progression, late transition No significant change since last tracing Confirmed by Christy Gentles  MD, Chandra Asher ([id number]) on 01/21/2017 4:11:51 AM       Radiology No results found.  Procedures Procedures (including critical care time)  Medications Ordered in ED Medications  ondansetron (ZOFRAN-ODT) disintegrating tablet 8 mg (8 mg Oral Given 01/21/17 0346)     Initial Impression / Assessment and Plan / ED Course  I have reviewed the triage vital signs and the nursing notes.  Pertinent labs  results that were available during my care of the patient were reviewed by me and  considered in my medical decision making (see chart for details).     Pt well appearing I have strong suspicion that most of his symptoms due to opiate withdrawal He wishes to stop oxycodone He will use tramadol as needed for pain Will give zofran for his nausea as he tolerated this well and felt improved in the ED He ambulated well  As for constipation,he had large BM in the ED and felt improved, no signs of obstruction, but he still requested miralax  As for ?kidney stone, this is already improving, so I don't feel emergent imaging required Final Clinical Impressions(s) / ED Diagnoses   Final diagnoses:  Right flank pain  Anxiety  Post-operative pain   I personally performed the services described in this documentation, which was scribed in my presence. The recorded information has been reviewed and is accurate.     New Prescriptions Discharge Medication List as of 01/21/2017  5:47 AM    START taking these medications   Details  ondansetron (ZOFRAN ODT) 8 MG disintegrating tablet Take 1 tablet (8 mg total) by mouth every 8 (eight) hours as needed., Starting Fri 01/21/2017, Print    polyethylene glycol (MIRALAX / GLYCOLAX) packet Take 17 g by mouth daily., Starting Fri 01/21/2017, Print         Ripley Fraise, MD 01/21/17 [phone]

## 2017-01-21 NOTE — ED Notes (Signed)
Pt verbalized understanding of d/c instructions and has no further questions. Pt is stable, A&Ox4, VSS.  

## 2017-01-21 NOTE — Discharge Instructions (Signed)

## 2017-01-21 NOTE — ED Triage Notes (Signed)
Pt was recently here for knee surgery, pt states he has been constipated, back pain and R side, hx of kidney stones. Pt also having nausea, pt states he woke up with a panic attack, crying and hyperventilating in triage.

## 2017-01-26 DIAGNOSIS — M1711 Unilateral primary osteoarthritis, right knee: Secondary | ICD-10-CM | POA: Diagnosis not present

## 2017-01-28 DIAGNOSIS — M1711 Unilateral primary osteoarthritis, right knee: Secondary | ICD-10-CM | POA: Diagnosis not present

## 2017-02-01 DIAGNOSIS — Z96651 Presence of right artificial knee joint: Secondary | ICD-10-CM | POA: Diagnosis not present

## 2017-02-01 DIAGNOSIS — Z471 Aftercare following joint replacement surgery: Secondary | ICD-10-CM | POA: Diagnosis not present

## 2017-02-02 DIAGNOSIS — M1711 Unilateral primary osteoarthritis, right knee: Secondary | ICD-10-CM | POA: Diagnosis not present

## 2017-02-04 DIAGNOSIS — M1711 Unilateral primary osteoarthritis, right knee: Secondary | ICD-10-CM | POA: Diagnosis not present

## 2017-02-09 DIAGNOSIS — M1711 Unilateral primary osteoarthritis, right knee: Secondary | ICD-10-CM | POA: Diagnosis not present

## 2017-02-11 DIAGNOSIS — M1711 Unilateral primary osteoarthritis, right knee: Secondary | ICD-10-CM | POA: Diagnosis not present

## 2017-02-18 DIAGNOSIS — M1711 Unilateral primary osteoarthritis, right knee: Secondary | ICD-10-CM | POA: Diagnosis not present

## 2017-04-18 DIAGNOSIS — I10 Essential (primary) hypertension: Secondary | ICD-10-CM | POA: Diagnosis not present

## 2017-04-18 DIAGNOSIS — E7800 Pure hypercholesterolemia, unspecified: Secondary | ICD-10-CM | POA: Diagnosis not present

## 2017-04-18 DIAGNOSIS — Z87442 Personal history of urinary calculi: Secondary | ICD-10-CM | POA: Diagnosis not present

## 2017-04-18 DIAGNOSIS — Z0000 Encounter for general adult medical examination without abnormal findings: Secondary | ICD-10-CM | POA: Diagnosis not present

## 2017-04-18 DIAGNOSIS — Z125 Encounter for screening for malignant neoplasm of prostate: Secondary | ICD-10-CM | POA: Diagnosis not present

## 2017-05-25 DIAGNOSIS — Z23 Encounter for immunization: Secondary | ICD-10-CM | POA: Diagnosis not present

## 2017-06-22 DIAGNOSIS — H524 Presbyopia: Secondary | ICD-10-CM | POA: Diagnosis not present

## 2017-06-22 DIAGNOSIS — H35362 Drusen (degenerative) of macula, left eye: Secondary | ICD-10-CM | POA: Diagnosis not present

## 2017-07-25 DIAGNOSIS — Z23 Encounter for immunization: Secondary | ICD-10-CM | POA: Diagnosis not present

## 2017-10-04 DIAGNOSIS — Z23 Encounter for immunization: Secondary | ICD-10-CM | POA: Diagnosis not present

## 2017-11-22 DIAGNOSIS — H40053 Ocular hypertension, bilateral: Secondary | ICD-10-CM | POA: Diagnosis not present

## 2017-11-24 DIAGNOSIS — Z471 Aftercare following joint replacement surgery: Secondary | ICD-10-CM | POA: Diagnosis not present

## 2017-11-24 DIAGNOSIS — M1711 Unilateral primary osteoarthritis, right knee: Secondary | ICD-10-CM | POA: Diagnosis not present

## 2017-11-24 DIAGNOSIS — Z96651 Presence of right artificial knee joint: Secondary | ICD-10-CM | POA: Diagnosis not present

## 2018-04-21 DIAGNOSIS — E7800 Pure hypercholesterolemia, unspecified: Secondary | ICD-10-CM | POA: Diagnosis not present

## 2018-04-21 DIAGNOSIS — Z125 Encounter for screening for malignant neoplasm of prostate: Secondary | ICD-10-CM | POA: Diagnosis not present

## 2018-04-21 DIAGNOSIS — I10 Essential (primary) hypertension: Secondary | ICD-10-CM | POA: Diagnosis not present

## 2018-04-21 DIAGNOSIS — Z13220 Encounter for screening for lipoid disorders: Secondary | ICD-10-CM | POA: Diagnosis not present

## 2018-04-21 DIAGNOSIS — L989 Disorder of the skin and subcutaneous tissue, unspecified: Secondary | ICD-10-CM | POA: Diagnosis not present

## 2018-04-21 DIAGNOSIS — Z0000 Encounter for general adult medical examination without abnormal findings: Secondary | ICD-10-CM | POA: Diagnosis not present

## 2018-04-21 DIAGNOSIS — R0981 Nasal congestion: Secondary | ICD-10-CM | POA: Diagnosis not present

## 2018-05-22 DIAGNOSIS — C44519 Basal cell carcinoma of skin of other part of trunk: Secondary | ICD-10-CM | POA: Diagnosis not present

## 2018-05-22 DIAGNOSIS — L821 Other seborrheic keratosis: Secondary | ICD-10-CM | POA: Diagnosis not present

## 2018-05-22 DIAGNOSIS — C4441 Basal cell carcinoma of skin of scalp and neck: Secondary | ICD-10-CM | POA: Diagnosis not present

## 2018-05-22 DIAGNOSIS — D485 Neoplasm of uncertain behavior of skin: Secondary | ICD-10-CM | POA: Diagnosis not present

## 2018-06-06 DIAGNOSIS — H524 Presbyopia: Secondary | ICD-10-CM | POA: Diagnosis not present

## 2018-07-11 DIAGNOSIS — C4441 Basal cell carcinoma of skin of scalp and neck: Secondary | ICD-10-CM | POA: Diagnosis not present

## 2018-07-11 DIAGNOSIS — C44319 Basal cell carcinoma of skin of other parts of face: Secondary | ICD-10-CM | POA: Diagnosis not present

## 2018-09-25 DIAGNOSIS — Z23 Encounter for immunization: Secondary | ICD-10-CM | POA: Diagnosis not present

## 2019-01-25 DIAGNOSIS — K580 Irritable bowel syndrome with diarrhea: Secondary | ICD-10-CM | POA: Diagnosis not present

## 2019-01-25 DIAGNOSIS — K529 Noninfective gastroenteritis and colitis, unspecified: Secondary | ICD-10-CM | POA: Diagnosis not present

## 2019-02-22 ENCOUNTER — Other Ambulatory Visit: Payer: Self-pay

## 2019-02-22 ENCOUNTER — Emergency Department (HOSPITAL_COMMUNITY)
Admission: EM | Admit: 2019-02-22 | Discharge: 2019-02-22 | Disposition: A | Discharge status: Home / Self Care | Payer: 59 | Attending: Emergency Medicine | Admitting: Emergency Medicine

## 2019-02-22 ENCOUNTER — Emergency Department (HOSPITAL_COMMUNITY): Payer: 59

## 2019-02-22 ENCOUNTER — Encounter (HOSPITAL_COMMUNITY): Payer: Self-pay | Admitting: Emergency Medicine

## 2019-02-22 DIAGNOSIS — R109 Unspecified abdominal pain: Secondary | ICD-10-CM | POA: Diagnosis not present

## 2019-02-22 DIAGNOSIS — Z79899 Other long term (current) drug therapy: Secondary | ICD-10-CM | POA: Insufficient documentation

## 2019-02-22 DIAGNOSIS — N201 Calculus of ureter: Secondary | ICD-10-CM

## 2019-02-22 DIAGNOSIS — N202 Calculus of kidney with calculus of ureter: Secondary | ICD-10-CM | POA: Insufficient documentation

## 2019-02-22 DIAGNOSIS — R079 Chest pain, unspecified: Secondary | ICD-10-CM | POA: Diagnosis not present

## 2019-02-22 DIAGNOSIS — N132 Hydronephrosis with renal and ureteral calculous obstruction: Secondary | ICD-10-CM | POA: Diagnosis not present

## 2019-02-22 DIAGNOSIS — I10 Essential (primary) hypertension: Secondary | ICD-10-CM | POA: Diagnosis not present

## 2019-02-22 DIAGNOSIS — N200 Calculus of kidney: Secondary | ICD-10-CM | POA: Diagnosis not present

## 2019-02-22 LAB — URINALYSIS, ROUTINE W REFLEX MICROSCOPIC
Bilirubin Urine: NEGATIVE
Glucose, UA: NEGATIVE mg/dL
Ketones, ur: 5 mg/dL — AB
Leukocytes,Ua: NEGATIVE
Nitrite: NEGATIVE
Protein, ur: NEGATIVE mg/dL
Specific Gravity, Urine: 1.021 (ref 1.005–1.030)
pH: 5 (ref 5.0–8.0)

## 2019-02-22 LAB — CBC
HCT: 48 % (ref 39.0–52.0)
Hemoglobin: 16.3 g/dL (ref 13.0–17.0)
MCH: 30.5 pg (ref 26.0–34.0)
MCHC: 34 g/dL (ref 30.0–36.0)
MCV: 89.7 fL (ref 80.0–100.0)
Platelets: 321 10*3/uL (ref 150–400)
RBC: 5.35 MIL/uL (ref 4.22–5.81)
RDW: 12.2 % (ref 11.5–15.5)
WBC: 17 10*3/uL — ABNORMAL HIGH (ref 4.0–10.5)
nRBC: 0 % (ref 0.0–0.2)

## 2019-02-22 LAB — BASIC METABOLIC PANEL
Anion gap: 14 (ref 5–15)
BUN: 22 mg/dL (ref 8–23)
CO2: 23 mmol/L (ref 22–32)
Calcium: 9.3 mg/dL (ref 8.9–10.3)
Chloride: 102 mmol/L (ref 98–111)
Creatinine, Ser: 1.3 mg/dL — ABNORMAL HIGH (ref 0.61–1.24)
GFR calc Af Amer: >60 mL/min (ref >60)
GFR calc non Af Amer: 58 mL/min — ABNORMAL LOW (ref >60)
Glucose, Bld: 143 mg/dL — ABNORMAL HIGH (ref 70–99)
Potassium: 3.9 mmol/L (ref 3.5–5.1)
Sodium: 139 mmol/L (ref 135–145)

## 2019-02-22 LAB — I-STAT TROPONIN, ED: Troponin i, poc: 0 ng/mL (ref 0.00–0.08)

## 2019-02-22 MED ORDER — ONDANSETRON 8 MG PO TBDP: 8.0000 mg | ORAL_TABLET | Freq: Three times a day (TID) | ORAL | 0 refills | Status: AC | PRN

## 2019-02-22 MED ORDER — LORAZEPAM 2 MG/ML IJ SOLN: 1.0000 mg | Freq: Once | INTRAMUSCULAR | Status: DC

## 2019-02-22 MED ORDER — OXYCODONE-ACETAMINOPHEN 5-325 MG PO TABS: 1.0000 | ORAL_TABLET | ORAL | 0 refills | Status: DC | PRN

## 2019-02-22 MED ORDER — IBUPROFEN 600 MG PO TABS: 600.0000 mg | ORAL_TABLET | Freq: Three times a day (TID) | ORAL | 0 refills | Status: DC | PRN

## 2019-02-22 MED ORDER — HYDROMORPHONE HCL 1 MG/ML IJ SOLN
INTRAMUSCULAR | Status: AC
  Filled 2019-02-22: qty 1

## 2019-02-22 MED ORDER — TAMSULOSIN HCL 0.4 MG PO CAPS: 0.4000 mg | ORAL_CAPSULE | Freq: Every day | ORAL | 0 refills | Status: DC

## 2019-02-22 MED ORDER — ONDANSETRON HCL 4 MG/2ML IJ SOLN
INTRAMUSCULAR | Status: AC
  Administered 2019-02-22: 4 mg
  Filled 2019-02-22: qty 2

## 2019-02-22 MED ORDER — KETOROLAC TROMETHAMINE 30 MG/ML IJ SOLN
30.0000 mg | Freq: Once | INTRAMUSCULAR | Status: AC
Start: 2019-02-22 — End: 2019-02-22
  Administered 2019-02-22: 30 mg via INTRAVENOUS
  Filled 2019-02-22: qty 1

## 2019-02-22 MED ORDER — MORPHINE SULFATE (PF) 4 MG/ML IV SOLN
6.0000 mg | Freq: Once | INTRAVENOUS | Status: AC
  Administered 2019-02-22: 6 mg via INTRAVENOUS
  Filled 2019-02-22: qty 2

## 2019-02-22 MED ORDER — LORAZEPAM 2 MG/ML IJ SOLN
0.5000 mg | Freq: Once | INTRAMUSCULAR | Status: AC
  Administered 2019-02-22: 0.5 mg via INTRAVENOUS
  Filled 2019-02-22: qty 1

## 2019-02-22 NOTE — ED Notes (Signed)
Patient transported to CT 

## 2019-02-22 NOTE — ED Triage Notes (Addendum)
Patient reports sudden onset left flank pain with multiple emesis and diarrhea last night, denies hematuria or dysuria , mild chills /no fever .

## 2019-02-22 NOTE — ED Provider Notes (Signed)
[place] EMERGENCY DEPARTMENT Provider Note   CSN: [id number] Arrival date & time: 02/22/19  3903    History   Chief Complaint Chief Complaint  Patient presents with   Flank Pain    Left    HPI John Buck is a 63 y.o. male.     HPI Patient is a 63 year old male presents the emergency department complaints of acute onset left flank pain last night.  He has a longstanding history of nephrolithiasis.  He denies fevers and chills.  Reports nausea vomiting.  His pain is severe in severity.  It radiates to the left lower quadrant.  No dysuria or urinary frequency.  No fevers.  Currently vomiting in the room.   Past Medical History:  Diagnosis Date   Arthritis    osteoarthritis   History of kidney stones    Hypertension    pt denies    Patient Active Problem List   Diagnosis Date Noted   OA (osteoarthritis) of knee 12/27/2016   Peripheral edema 02/29/2012   Keratosis, inflamed seborrheic 05/04/2011   HEARING LOSS, RIGHT EAR 03/16/2010   PES PLANUS 04/29/2009   HYPERTENSION 04/01/2008   NEPHROLITHIASIS, HX OF 04/01/2008    Past Surgical History:  Procedure Laterality Date   KNEE ARTHROSCOPY W/ ACL RECONSTRUCTION     x2   TOTAL KNEE ARTHROPLASTY Right 12/27/2016   Procedure: RIGHT TOTAL KNEE ARTHROPLASTY;  Surgeon: Gaynelle Arabian, MD;  Location: [facility];  Service: Orthopedics;  Laterality: Right;        Home Medications    Prior to Admission medications   Medication Sig Start Date End Date Taking? Authorizing Provider  acetaminophen (TYLENOL) 500 MG tablet Take 500 mg by mouth as needed.    [provider]  furosemide (LASIX) 20 MG tablet Take 1 tablet (20 mg total) by mouth daily. 04/10/12 04/10/13  Dorena Cookey, MD  hydrochlorothiazide (HYDRODIURIL) 25 MG tablet TAKE 1/2 TABLET EVERY DAY Patient taking differently: TAKE 1/2 TABLET (12.5 mg)EVERY DAY 11/24/15   Dorena Cookey, MD  hydrocortisone (PROCTOSOL HC) 2.5 %  rectal cream INSERT 1 APPLICATION PER RECTUM DAILY AT BEDTIME AS NEEDED Patient taking differently: Place 1 application rectally as needed for hemorrhoids. INSERT 1 APPLICATION PER RECTUM DAILY AT BEDTIME AS NEEDED 03/18/14   Dorena Cookey, MD  methocarbamol (ROBAXIN) 500 MG tablet Take 1 tablet (500 mg total) by mouth every 6 (six) hours as needed for muscle spasms. 12/29/16   Perkins, Alexzandrew L, PA-C  ondansetron (ZOFRAN ODT) 8 MG disintegrating tablet Take 1 tablet (8 mg total) by mouth every 8 (eight) hours as needed. 01/21/17   Ripley Fraise, MD  polyethylene glycol [facility] / Floria Raveling) packet Take 17 g by mouth daily. 01/21/17   Ripley Fraise, MD  rivaroxaban (XARELTO) 10 MG TABS tablet Take 1 tablet (10 mg total) by mouth daily with breakfast. Take Xarelto for two and a half more weeks following discharge from the hospital, then discontinue Xarelto. Once the patient has completed the Xarelto, they may resume the 325 mg Aspirin. 12/30/16   Perkins, Alexzandrew L, PA-C  traMADol (ULTRAM) 50 MG tablet Take 1-2 tablets (50-100 mg total) by mouth every 6 (six) hours as needed (mild pain). 12/29/16   Perkins, Alexzandrew L, PA-C    Family History Family History  Problem Relation Age of Onset   Hypertension Mother    Colon cancer Father 33   Skin cancer Father    Seizures Father    Skin cancer  Brother    Hypertension Brother     Social History Social History   Tobacco Use   Smoking status: Never Smoker   Smokeless tobacco: Never Used  Substance Use Topics   Alcohol use: No   Drug use: No     Allergies   Butorphanol tartrate and Hydromorphone hcl   Review of Systems Review of Systems  All other systems reviewed and are negative.    Physical Exam Updated Vital Signs BP (!) 137/94 (BP Location: Left Arm)    Pulse 82    Temp 97.7 F (36.5 C) (Oral)    Resp 20    SpO2 98%   Physical Exam Vitals signs and nursing note reviewed.  Constitutional:       Appearance: He is well-developed.  HENT:     Head: Normocephalic and atraumatic.  Neck:     Musculoskeletal: Normal range of motion.  Cardiovascular:     Rate and Rhythm: Normal rate and regular rhythm.     Heart sounds: Normal heart sounds.  Pulmonary:     Effort: Pulmonary effort is normal. No respiratory distress.     Breath sounds: Normal breath sounds.  Abdominal:     General: There is no distension.     Palpations: Abdomen is soft.     Tenderness: There is no abdominal tenderness.  Musculoskeletal: Normal range of motion.  Skin:    General: Skin is warm and dry.  Neurological:     Mental Status: He is alert and oriented to person, place, and time.  Psychiatric:        Judgment: Judgment normal.      ED Treatments / Results  Labs (all labs ordered are listed, but only abnormal results are displayed) Labs Reviewed  CBC - Abnormal; Notable for the following components:      Result Value   WBC 17.0 (*)    All other components within normal limits  BASIC METABOLIC PANEL - Abnormal; Notable for the following components:   Glucose, Bld 143 (*)    Creatinine, Ser 1.30 (*)    GFR calc non Af Amer 58 (*)    All other components within normal limits  URINALYSIS, ROUTINE W REFLEX MICROSCOPIC - Abnormal; Notable for the following components:   APPearance HAZY (*)    Hgb urine dipstick MODERATE (*)    Ketones, ur 5 (*)    Bacteria, UA RARE (*)    All other components within normal limits  I-STAT TROPONIN, ED    EKG EKG Interpretation  Date/Time:  Thursday February 22 2019 08:00:10 EDT Ventricular Rate:  92 PR Interval:    QRS Duration: 100 QT Interval:  382 QTC Calculation: 473 R Axis:   0 Text Interpretation:  Sinus rhythm Ventricular premature complex Borderline prolonged PR interval No significant change was found Confirmed by Jola Schmidt [phone]) on 02/22/2019 1:14:37 PM   Radiology Dg Chest Portable 1 View  Result Date: 02/22/2019 CLINICAL DATA:  Mid chest  pain this morning, kidney stone, follow-up, hypertension EXAM: PORTABLE CHEST 1 VIEW COMPARISON:  Portable exam 0811 hours without priors for comparison FINDINGS: Normal heart size, mediastinal contours, and pulmonary vascularity. Lungs clear. No pleural effusion or pneumothorax. Bones unremarkable. IMPRESSION: No acute abnormalities. Electronically Signed   By: Lavonia Dana M.D.   On: 02/22/2019 08:23   Ct Renal Stone Study  Result Date: 02/22/2019 CLINICAL DATA:  Sudden left flank pain starting last night EXAM: CT ABDOMEN AND PELVIS WITHOUT CONTRAST TECHNIQUE: Multidetector CT imaging of the  abdomen and pelvis was performed following the standard protocol without IV contrast. COMPARISON:  July 07, 2011 FINDINGS: Lower chest: Minimal dependent atelectasis of posterior lung bases are noted. The heart size is normal. Hepatobiliary: Low-density lesions are identified in the liver largest measures 2.5 cm unchanged compared prior exam. Gallstones are noted in the gallbladder. There is no inflammatory change around the gallbladder. The biliary tree is normal. Pancreas: Unremarkable. No pancreatic ductal dilatation or surrounding inflammatory changes. Spleen: Normal in size without focal abnormality. Adrenals/Urinary Tract: The bilateral adrenal glands are normal. There are nonobstructing stones in bilateral kidneys. There is no right hydronephrosis. There is left hydroureteronephrosis due to obstruction by a 6 mm stone in the distal left ureter. A small stone is identified in the bladder lumen. Stomach/Bowel: Stomach is within normal limits. The appendix is not seen but no inflammation is noted around cecum. No evidence of bowel wall thickening, distention, or inflammatory changes. Vascular/Lymphatic: No significant vascular findings are present. No enlarged abdominal or pelvic lymph nodes. Reproductive: Prostate gland is normal. Other: Right inguinal herniation of mesenteric fat is identified. Small umbilical  herniation of mesenteric fat is noted. Musculoskeletal: Degenerative joint changes of the spine are noted. IMPRESSION: Left hydroureteronephrosis due to obstruction by 6 mm stone in the distal left ureter. Electronically Signed   By: Abelardo Diesel M.D.   On: 02/22/2019 09:52    Procedures Procedures (including critical care time)  Medications Ordered in ED Medications  ondansetron (ZOFRAN) 4 MG/2ML injection (4 mg  Given 02/22/19 0716)  morphine 4 MG/ML injection 6 mg (6 mg Intravenous Given 02/22/19 0723)  ketorolac (TORADOL) 30 MG/ML injection 30 mg (30 mg Intravenous Given 02/22/19 0723)     Initial Impression / Assessment and Plan / ED Course  I have reviewed the triage vital signs and the nursing notes.  Pertinent labs & imaging results that were available during my care of the patient were reviewed by me and considered in my medical decision making (see chart for details).        6 mm left distal ureteral stone.  Feels much better after symptomatic management here in the emergency department.  Plan for discharge home with standard stone precautions.  Patient will be followed up by urology.  He understands return to the emergency department for new or worsening symptoms.  He did have transient chest discomfort here.  I suspect this is more anxiety.  This was able to be relieved with calming speech.  EKG obtained without ischemic changes.  Feels much better.  Final Clinical Impressions(s) / ED Diagnoses   Final diagnoses:  Left ureteral stone    ED Discharge Orders         Ordered    tamsulosin (FLOMAX) 0.4 MG CAPS capsule  Daily     02/22/19 1121    oxyCODONE-acetaminophen (PERCOCET/ROXICET) 5-325 MG tablet  Every 4 hours PRN     02/22/19 1121    ondansetron (ZOFRAN ODT) 8 MG disintegrating tablet  Every 8 hours PRN     02/22/19 1121    ibuprofen (ADVIL,MOTRIN) 600 MG tablet  Every 8 hours PRN     02/22/19 [place], Marixa Mellott, MD 02/22/19 1316

## 2019-02-22 NOTE — ED Notes (Signed)
Patient verbalizes understanding of discharge instructions. Opportunity for questioning and answers were provided. Armband removed by staff, pt discharged from ED.  

## 2019-02-26 ENCOUNTER — Other Ambulatory Visit: Payer: Self-pay | Admitting: Urology

## 2019-02-26 DIAGNOSIS — N201 Calculus of ureter: Secondary | ICD-10-CM | POA: Diagnosis not present

## 2019-02-26 DIAGNOSIS — N23 Unspecified renal colic: Secondary | ICD-10-CM | POA: Diagnosis not present

## 2019-03-01 ENCOUNTER — Ambulatory Visit (HOSPITAL_COMMUNITY)
Admission: RE | Admit: 2019-03-01 | Discharge: 2019-03-01 | Disposition: A | Discharge status: Home / Self Care | Payer: 59 | Attending: Urology | Admitting: Urology

## 2019-03-01 ENCOUNTER — Encounter (HOSPITAL_COMMUNITY)
Admission: RE | Disposition: A | Discharge status: Home / Self Care | Payer: Self-pay | Source: Home / Self Care | Attending: Urology

## 2019-03-01 ENCOUNTER — Encounter (HOSPITAL_COMMUNITY): Payer: Self-pay | Admitting: General Practice

## 2019-03-01 ENCOUNTER — Ambulatory Visit (HOSPITAL_COMMUNITY): Payer: 59

## 2019-03-01 DIAGNOSIS — I10 Essential (primary) hypertension: Secondary | ICD-10-CM | POA: Insufficient documentation

## 2019-03-01 DIAGNOSIS — R109 Unspecified abdominal pain: Secondary | ICD-10-CM | POA: Diagnosis present

## 2019-03-01 DIAGNOSIS — Z79899 Other long term (current) drug therapy: Secondary | ICD-10-CM | POA: Insufficient documentation

## 2019-03-01 DIAGNOSIS — Z96651 Presence of right artificial knee joint: Secondary | ICD-10-CM | POA: Insufficient documentation

## 2019-03-01 DIAGNOSIS — Z8249 Family history of ischemic heart disease and other diseases of the circulatory system: Secondary | ICD-10-CM | POA: Insufficient documentation

## 2019-03-01 DIAGNOSIS — N201 Calculus of ureter: Secondary | ICD-10-CM | POA: Insufficient documentation

## 2019-03-01 DIAGNOSIS — Z859 Personal history of malignant neoplasm, unspecified: Secondary | ICD-10-CM | POA: Insufficient documentation

## 2019-03-01 DIAGNOSIS — Z7982 Long term (current) use of aspirin: Secondary | ICD-10-CM | POA: Diagnosis not present

## 2019-03-01 DIAGNOSIS — Z791 Long term (current) use of non-steroidal anti-inflammatories (NSAID): Secondary | ICD-10-CM | POA: Diagnosis not present

## 2019-03-01 HISTORY — PX: EXTRACORPOREAL SHOCK WAVE LITHOTRIPSY: SHX1557

## 2019-03-01 SURGERY — LITHOTRIPSY, ESWL
Anesthesia: LOCAL | Laterality: Left

## 2019-03-01 MED ORDER — DIPHENHYDRAMINE HCL 25 MG PO CAPS
25.0000 mg | ORAL_CAPSULE | ORAL | Status: AC
  Administered 2019-03-01: 25 mg via ORAL
  Filled 2019-03-01: qty 1

## 2019-03-01 MED ORDER — SODIUM CHLORIDE 0.9 % IV SOLN
INTRAVENOUS | Status: DC
  Administered 2019-03-01: 09:00:00 via INTRAVENOUS

## 2019-03-01 MED ORDER — CIPROFLOXACIN HCL 500 MG PO TABS
500.0000 mg | ORAL_TABLET | ORAL | Status: AC
  Administered 2019-03-01: 500 mg via ORAL
  Filled 2019-03-01: qty 1

## 2019-03-01 MED ORDER — DIAZEPAM 5 MG PO TABS
10.0000 mg | ORAL_TABLET | ORAL | Status: AC
  Administered 2019-03-01: 10 mg via ORAL
  Filled 2019-03-01: qty 2

## 2019-03-01 MED ORDER — TAMSULOSIN HCL 0.4 MG PO CAPS: 0.4000 mg | ORAL_CAPSULE | Freq: Every day | ORAL | 1 refills | Status: DC

## 2019-03-01 NOTE — H&P (Signed)
Pre-op H&P  CC: left flank pain   HPI: John Buck is a 63 year old male with a history of kidney stones.   -The patient presents today with a 7 week history of intermittent, sharp left flank pain that radiates into the left inguinal region. His pain was initially associated with nausea and vomiting that has since subsided. Pain 3-4 out of 10 today--currently taking oxycodone. He is also taking tamsulosin and zofran, which helps with his pain and nausea, respectively.   CTSS (02/22/19--images viewed)- shows a 7 mm left distal ureteral stone with signs of hydronephrosis     ALLERGIES: Dilaudid TABS Stadol SOLN    MEDICATIONS: Aspirin  Tamsulosin Hcl 0.4 mg capsule  Amlodipine Besylate 5 mg tablet  Aspirin 325 mg tablet Oral  Fish Oil  Glucosamine & Chondroitin  HydroCHLOROthiazide TABS Oral  Ibuprofen 600 mg tablet  Oxycodone-Acetaminophen 5-325 MG Oral Tablet 6 Oral  Stomach Meds     GU PSH: None     PSH Notes: Knee Surgery  ureteroscopy    NON-GU PSH: Knee replacement, Right - 2018    GU PMH: Flank Pain, Generalized abdominal pain - 2014 Renal calculus, Nephrolithiasis - 2014 Ureteral calculus, Calculus of ureter - 2014, Calculus of left ureter, - 2014      PMH Notes:  2008-03-01 14:19:23 - Note: Normal Routine History And Physical Adult  2011-07-07 14:28:39 - Note: No Medical Problems   NON-GU PMH: Arthritis Hypercholesterolemia Hypertension Skin Cancer, History    FAMILY HISTORY: 1 Daughter - Runs in Family 2 sons - Runs in Family Acute Myocardial Infarction - Father Colon Cancer - Father Death In The Family Father - Runs In Family Family Health Status Number - Runs In Family father deceased - Father Hypertension - Brother, Mother nephrolithiasis - Sister, Mother, Brother patient's mother is still living - Mother   SOCIAL HISTORY: Marital Status: Married Preferred Language: English; Ethnicity: Not Hispanic Or Latino; Race: White Current Smoking Status:  Patient has never smoked.   Tobacco Use Assessment Completed: Used Tobacco in last 30 days? Has never drank.  Patient's occupation is/was Retired.     Notes: Alcohol Use, Never A Smoker, Occupation:, Marital History - Currently Married, Caffeine Use  pt states he drinks a soda rarely   REVIEW OF SYSTEMS:    GU Review Male:   Patient reports frequent urination, hard to postpone urination, and get up at night to urinate. Patient denies burning/ pain with urination, leakage of urine, stream starts and stops, trouble starting your stream, have to strain to urinate , erection problems, and penile pain.  Gastrointestinal (Upper):   Patient reports nausea and vomiting. Patient denies indigestion/ heartburn.  Gastrointestinal (Lower):   Patient reports constipation and diarrhea.   Constitutional:   Patient denies fever, night sweats, weight loss, and fatigue.  Skin:   Patient denies skin rash/ lesion and itching.  Eyes:   Patient denies blurred vision and double vision.  Ears/ Nose/ Throat:   Patient denies sore throat and sinus problems.  Hematologic/Lymphatic:   Patient denies swollen glands and easy bruising.  Cardiovascular:   Patient denies leg swelling and chest pains.  Respiratory:   Patient denies cough and shortness of breath.  Endocrine:   Patient denies excessive thirst.  Musculoskeletal:   Patient reports back pain. Patient denies joint pain.  Neurological:   Patient denies headaches and dizziness.  Psychologic:   Patient denies depression and anxiety.   Notes: pt states he has a weak stream  VITAL SIGNS:      02/26/2019 01:59 PM  Weight 220 lb / 99.79 kg  Height 70 in / 177.8 cm  BP 119/81 mmHg  Heart Rate 102 /min  Temperature 98.2 F / 36.7 C  BMI 31.6 kg/m   GU PHYSICAL EXAMINATION:    Anus and Perineum: No hemorrhoids. No anal stenosis. No rectal fissure, no anal fissure. No edema, no dimple, no perineal tenderness, no anal tenderness.  Scrotum: No lesions. No edema.  No cysts. No warts.  Epididymides: Right: no spermatocele, no masses, no cysts, no tenderness, no induration, no enlargement. Left: no spermatocele, no masses, no cysts, no tenderness, no induration, no enlargement.  Testes: No tenderness, no swelling, no enlargement left testes. No tenderness, no swelling, no enlargement right testes. Normal location left testes. Normal location right testes. No mass, no cyst, no varicocele, no hydrocele left testes. No mass, no cyst, no varicocele, no hydrocele right testes.  Urethral Meatus: Normal size. No lesion, no wart, no discharge, no polyp. Normal location.  Penis: Circumcised, no warts, no cracks. No dorsal Peyronie's plaques, no left corporal Peyronie's plaques, no right corporal Peyronie's plaques, no scarring, no warts. No balanitis, no meatal stenosis.  Prostate: 40 gram or 2+ size. Left lobe normal consistency, right lobe normal consistency. Symmetrical lobes. No prostate nodule. Left lobe no tenderness, right lobe no tenderness.  Seminal Vesicles: Nonpalpable.  Sphincter Tone: Normal sphincter. No rectal tenderness. No rectal mass.    MULTI-SYSTEM PHYSICAL EXAMINATION:    Constitutional: Well-nourished. No physical deformities. Normally developed. Good grooming.  Neck: Neck symmetrical, not swollen. Normal tracheal position.  Respiratory: No labored breathing, no use of accessory muscles.   Cardiovascular: Normal temperature, normal extremity pulses, no swelling, no varicosities.  Lymphatic: No enlargement of neck, axillae, groin.  Skin: No paleness, no jaundice, no cyanosis. No lesion, no ulcer, no rash.  Neurologic / Psychiatric: Oriented to time, oriented to place, oriented to person. No depression, no anxiety, no agitation.  Gastrointestinal: No mass, no tenderness, no rigidity, non obese abdomen.  Eyes: Normal conjunctivae. Normal eyelids.  Ears, Nose, Mouth, and Throat: Left ear no scars, no lesions, no masses. Right ear no scars, no  lesions, no masses. Nose no scars, no lesions, no masses. Normal hearing. Normal lips.  Musculoskeletal: Normal gait and station of head and neck.     PAST DATA REVIEWED:  Source Of History:  Patient   PROCEDURES:          Urinalysis w/Scope Dipstick Dipstick Cont'd Micro  Color: Yellow Bilirubin: Neg mg/dL WBC/hpf: NS (Not Seen)  Appearance: Clear Ketones: Neg mg/dL RBC/hpf: 10 - 20/hpf  Specific Gravity: 1.015 Blood: 2+ ery/uL Bacteria: Rare (0-9/hpf)  pH: 7.5 Protein: Neg mg/dL Cystals: NS (Not Seen)  Glucose: Neg mg/dL Urobilinogen: 0.2 mg/dL Casts: NS (Not Seen)    Nitrites: Neg Trichomonas: Not Present    Leukocyte Esterase: Neg leu/uL Mucous: Not Present      Epithelial Cells: 0 - 5/hpf      Yeast: NS (Not Seen)      Sperm: Not Present    ASSESSMENT:      ICD-10 Details  1 GU:   Ureteral calculus - N20.1 7 mm left distal ureteral stone  2   Renal colic - W09    PLAN:            Medications New Meds: Percocet 5 mg-325 mg tablet 1 tablet PO Q 4 H PRN   #20  0 Refill(s)  Schedule Return Visit/Planned Activity: ASAP - Schedule Surgery          Document Letter(s):  Created for Patient: Clinical Summary         Notes:   The risks, benefits and alternatives of LEFT ESWL was discussed with the patient. I described the risks which include arrhythmia, kidney contusion, kidney hemorrhage, need for transfusion, back discomfort, flank ecchymosis, flank abrasion, inability to break up stone, inability to pass stone fragments, Steinstrasse, infection associated with obstructing stones, need for different surgical procedure and possible need for repeat shockwave lithotripsy. The patient voices understanding and wishes to proceed.

## 2019-03-01 NOTE — Discharge Instructions (Signed)
Dietary Guidelines to Help Prevent Kidney Stones Kidney stones are deposits of minerals and salts that form inside your kidneys. Your risk of developing kidney stones may be greater depending on your diet, your lifestyle, the medicines you take, and whether you have certain medical conditions. Most people can reduce their chances of developing kidney stones by following the instructions below. Depending on your overall health and the type of kidney stones you tend to develop, your dietitian may give you more specific instructions. What are tips for following this plan? Reading food labels  Choose foods with "no salt added" or "low-salt" labels. Limit your sodium intake to less than 1500 mg per day.  Choose foods with calcium for each meal and snack. Try to eat about 300 mg of calcium at each meal. Foods that contain 200-500 mg of calcium per serving include: ? 8 oz (237 ml) of milk, fortified nondairy milk, and fortified fruit juice. ? 8 oz (237 ml) of kefir, yogurt, and soy yogurt. ? 4 oz (118 ml) of tofu. ? 1 oz of cheese. ? 1 cup (300 g) of dried figs. ? 1 cup (91 g) of cooked broccoli. ? 1-3 oz can of sardines or mackerel.  Most people need 1000 to 1500 mg of calcium each day. Talk to your dietitian about how much calcium is recommended for you. Shopping  Buy plenty of fresh fruits and vegetables. Most people do not need to avoid fruits and vegetables, even if they contain nutrients that may contribute to kidney stones.  When shopping for convenience foods, choose: ? Whole pieces of fruit. ? Premade salads with dressing on the side. ? Low-fat fruit and yogurt smoothies.  Avoid buying frozen meals or prepared deli foods.  Look for foods with live cultures, such as yogurt and kefir. Cooking  Do not add salt to food when cooking. Place a salt shaker on the table and allow each person to add his or her own salt to taste.  Use vegetable protein, such as beans, textured vegetable  protein (TVP), or tofu instead of meat in pasta, casseroles, and soups. Meal planning   Eat less salt, if told by your dietitian. To do this: ? Avoid eating processed or premade food. ? Avoid eating fast food.  Eat less animal protein, including cheese, meat, poultry, or fish, if told by your dietitian. To do this: ? Limit the number of times you have meat, poultry, fish, or cheese each week. Eat a diet free of meat at least 2 days a week. ? Eat only one serving each day of meat, poultry, fish, or seafood. ? When you prepare animal protein, cut pieces into small portion sizes. For most meat and fish, one serving is about the size of one deck of cards.  Eat at least 5 servings of fresh fruits and vegetables each day. To do this: ? Keep fruits and vegetables on hand for snacks. ? Eat 1 piece of fruit or a handful of berries with breakfast. ? Have a salad and fruit at lunch. ? Have two kinds of vegetables at dinner.  Limit foods that are high in a substance called oxalate. These include: ? Spinach. ? Rhubarb. ? Beets. ? Potato chips and french fries. ? Nuts.  If you regularly take a diuretic medicine, make sure to eat at least 1-2 fruits or vegetables high in potassium each day. These include: ? Avocado. ? Banana. ? Orange, prune, carrot, or tomato juice. ? Baked potato. ? Cabbage. ? Beans and split   If you regularly take a diuretic medicine, make sure to eat at least 1-2 fruits or vegetables high in potassium each day. These include:  ? Avocado.  ? Banana.  ? Orange, prune, carrot, or tomato juice.  ? Baked potato.  ? Cabbage.  ? Beans and split peas.  General instructions     Drink enough fluid to keep your urine clear or pale yellow. This is the most important thing you can do.   Talk to your health care provider and dietitian about taking daily supplements. Depending on your health and the cause of your kidney stones, you may be advised:  ? Not to take supplements with vitamin C.  ? To take a calcium supplement.  ? To take a daily probiotic supplement.  ? To take other supplements such as magnesium, fish oil, or vitamin B6.   Take all medicines and supplements as told by your health care provider.   Limit alcohol intake to no  more than 1 drink a day for nonpregnant women and 2 drinks a day for men. One drink equals 12 oz of beer, 5 oz of wine, or 1 oz of hard liquor.   Lose weight if told by your health care provider. Work with your dietitian to find strategies and an eating plan that works best for you.  What foods are not recommended?  Limit your intake of the following foods, or as told by your dietitian. Talk to your dietitian about specific foods you should avoid based on the type of kidney stones and your overall health.  Grains  Breads. Bagels. Rolls. Baked goods. Salted crackers. Cereal. Pasta.  Vegetables  Spinach. Rhubarb. Beets. Canned vegetables. Pickles. Olives.  Meats and other protein foods  Nuts. Nut butters. Large portions of meat, poultry, or fish. Salted or cured meats. Deli meats. Hot dogs. Sausages.  Dairy  Cheese.  Beverages  Regular soft drinks. Regular vegetable juice.  Seasonings and other foods  Seasoning blends with salt. Salad dressings. Canned soups. Soy sauce. Ketchup. Barbecue sauce. Canned pasta sauce. Casseroles. Pizza. Lasagna. Frozen meals. Potato chips. French fries.  Summary   You can reduce your risk of kidney stones by making changes to your diet.   The most important thing you can do is drink enough fluid. You should drink enough fluid to keep your urine clear or pale yellow.   Ask your health care provider or dietitian how much protein from animal sources you should eat each day, and also how much salt and calcium you should have each day.  This information is not intended to replace advice given to you by your health care provider. Make sure you discuss any questions you have with your health care provider.  Document Released: 03/19/2011 Document Revised: 11/02/2016 Document Reviewed: 11/02/2016  [employer]vier Interactive Patient Education  2019 [employer]

## 2019-03-01 NOTE — Op Note (Signed)
[facility] Operative Note  Treating Physician: Ellison Hughs, MD  Pre-op diagnosis: 7 mm left distal ureteral stone  Post-op diagnosis: Same   Procedure: LEFT ESWL  See Aris Everts OP note scanned into chart. Also because of the size, density, location and other factors that cannot be anticipated I feel this will likely be a staged procedure. This fact supersedes any indication in the scanned [place] stone operative note to the contrary

## 2019-03-02 ENCOUNTER — Encounter (HOSPITAL_COMMUNITY): Payer: Self-pay | Admitting: Urology

## 2020-02-21 ENCOUNTER — Ambulatory Visit: Payer: 59 | Attending: Internal Medicine

## 2020-02-21 DIAGNOSIS — Z23 Encounter for immunization: Secondary | ICD-10-CM

## 2020-02-21 NOTE — Progress Notes (Signed)
   Covid-19 Vaccination Clinic  Name:  John Buck    MRN: [id number] DOB: 03/05/1956  02/21/2020  Mr. John Buck was observed post Covid-19 immunization for 15 minutes without incident. He was provided with Vaccine Information Sheet and instruction to access the V-Safe system.   Mr. John Buck was instructed to call 911 with any severe reactions post vaccine: Marland Kitchen Difficulty breathing  . Swelling of face and throat  . A fast heartbeat  . A bad rash all over body  . Dizziness and weakness   Immunizations Administered    Name Date Dose VIS Date Route   Pfizer COVID-19 Vaccine 02/21/2020 12:42 PM 0.3 mL 11/16/2019 Intramuscular   Manufacturer: [place]   Lot: [id number]   [place]: [id number]

## 2020-03-17 ENCOUNTER — Ambulatory Visit: Payer: 59 | Attending: Internal Medicine

## 2020-03-17 DIAGNOSIS — Z23 Encounter for immunization: Secondary | ICD-10-CM

## 2020-03-17 NOTE — Progress Notes (Signed)
   Covid-19 Vaccination Clinic  Name:  John Buck    MRN: [id number] DOB: 28-Aug-1956  03/17/2020  [name] was observed post Covid-19 immunization for 15 minutes without incident. He was provided with Vaccine Information Sheet and instruction to access the V-Safe system.   [name] was instructed to call 911 with any severe reactions post vaccine: Marland Kitchen Difficulty breathing  . Swelling of face and throat  . A fast heartbeat  . A bad rash all over body  . Dizziness and weakness   Immunizations Administered    Name Date Dose VIS Date Route   Pfizer COVID-19 Vaccine 03/17/2020 10:36 AM 0.3 mL 11/16/2019 Intramuscular   Manufacturer: [place]   Lot: [id number]   [place]: [id number]

## 2020-11-26 IMAGING — CT CT RENAL STONE PROTOCOL
2 of 4 series · 16 of 46 positions shown, 18 images · non-contrast
Comparison: July 07, 2011

CLINICAL DATA: Sudden left flank pain starting last night

EXAM:
CT ABDOMEN AND PELVIS WITHOUT CONTRAST
TECHNIQUE: Multidetector CT imaging of the abdomen and pelvis was performed
following the standard protocol without IV contrast.

[Series 3: stone study 5.0 i30f 2 · axial · 0.85mm/px · z∈[+808,+1272]mm · 13 of 103 slices shown, 15 images]
[im 5/103  soft-tissue]
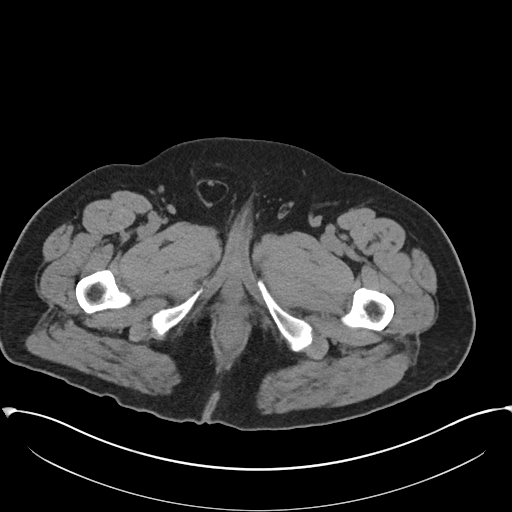
[im 5/103  bone]
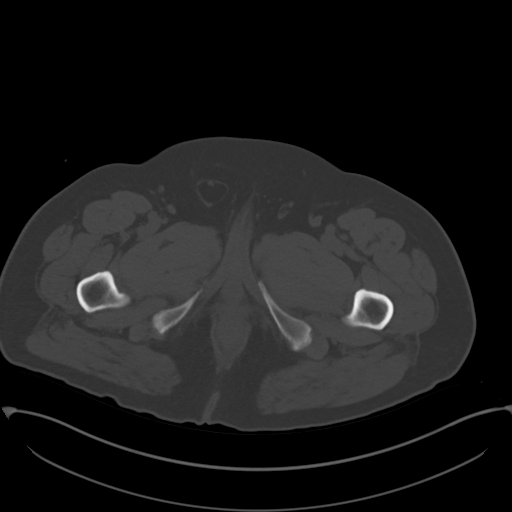
[im 14/103  soft-tissue]
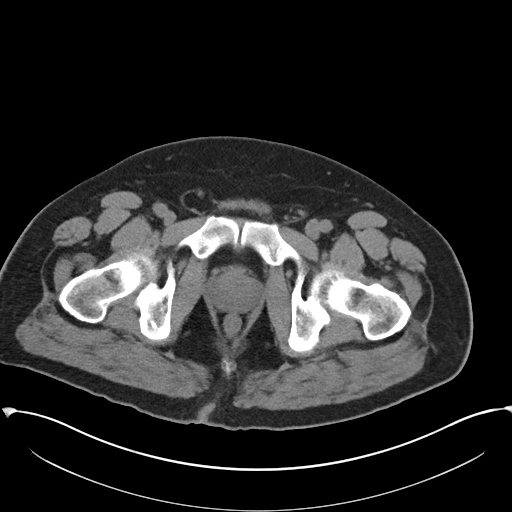
[im 23/103  soft-tissue]
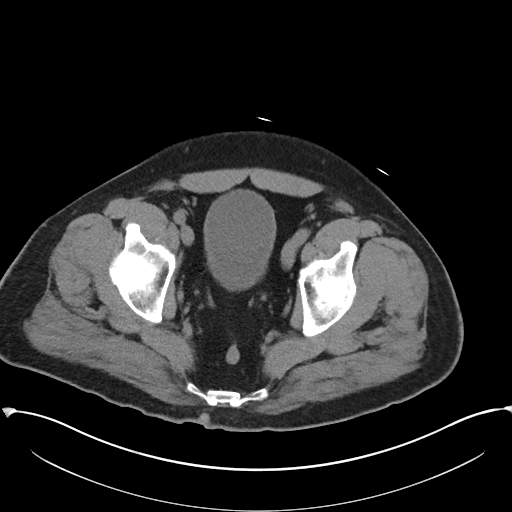
[im 27/103  soft-tissue]
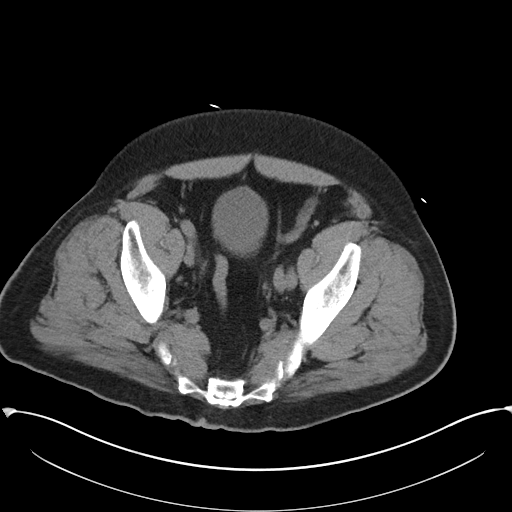
[im 36/103  soft-tissue]
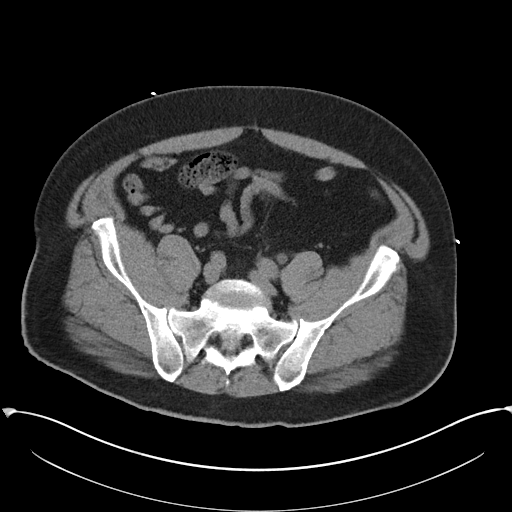
[im 45/103  soft-tissue]
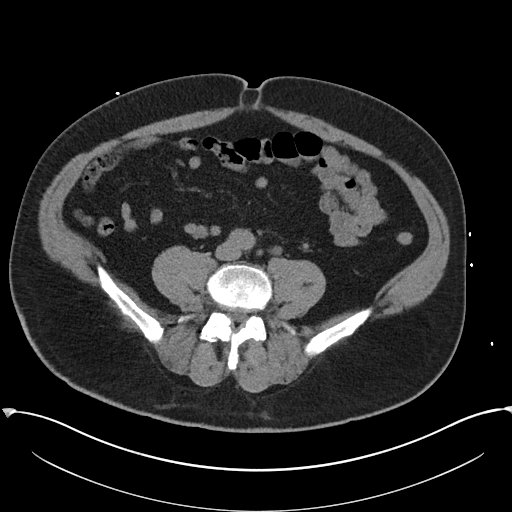
[im 54/103  soft-tissue]
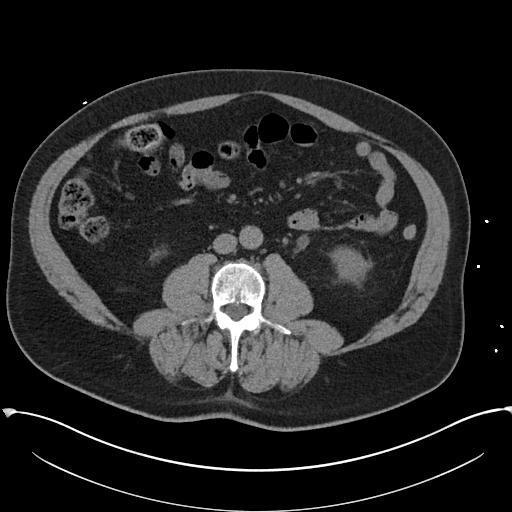
[im 58/103  soft-tissue]
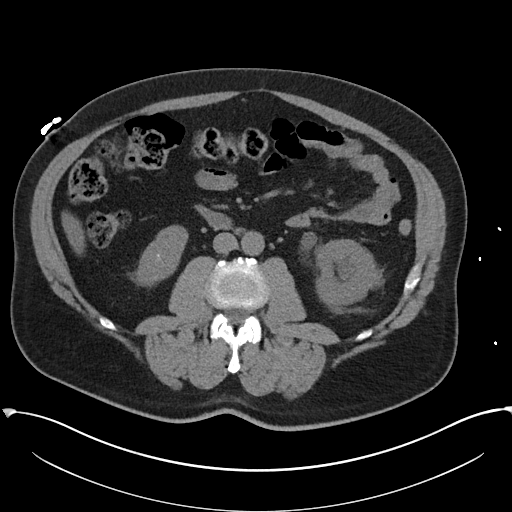
[im 67/103  soft-tissue]
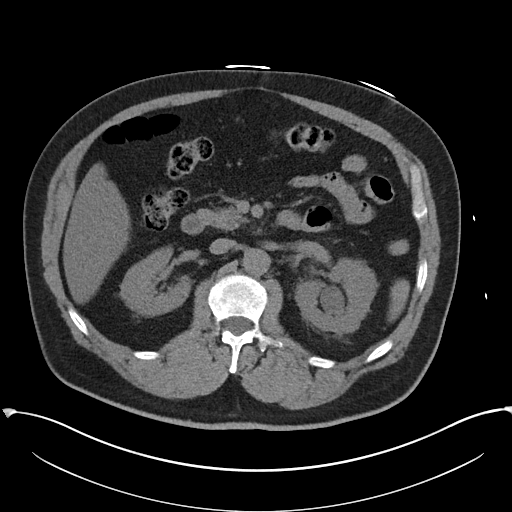
[im 67/103  bone]
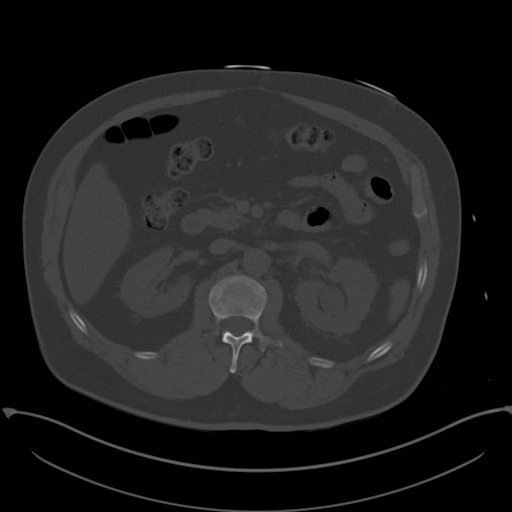
[im 76/103  soft-tissue]
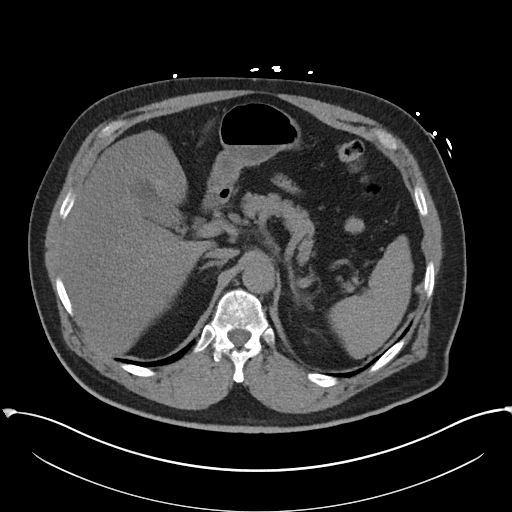
[im 80/103  soft-tissue]
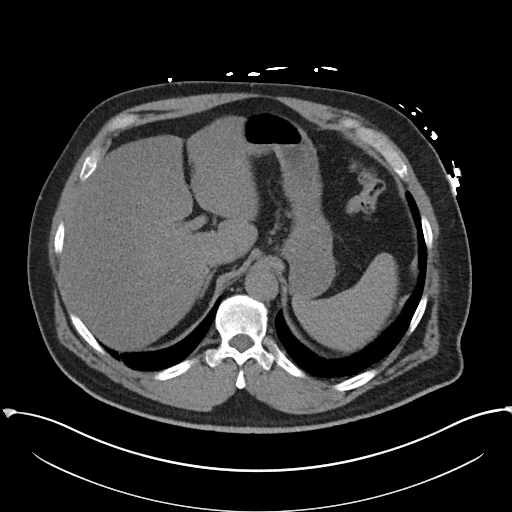
[im 89/103  soft-tissue]
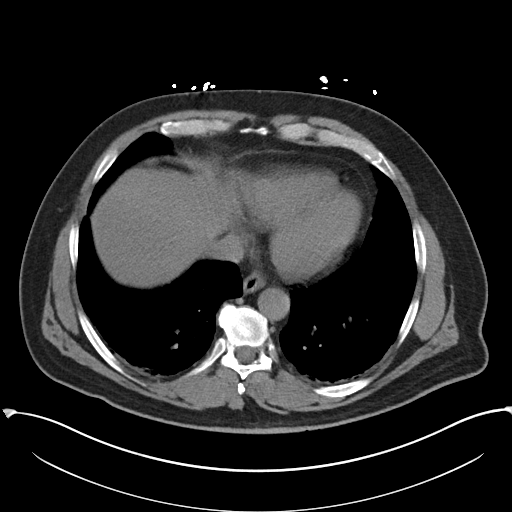
[im 98/103  soft-tissue]
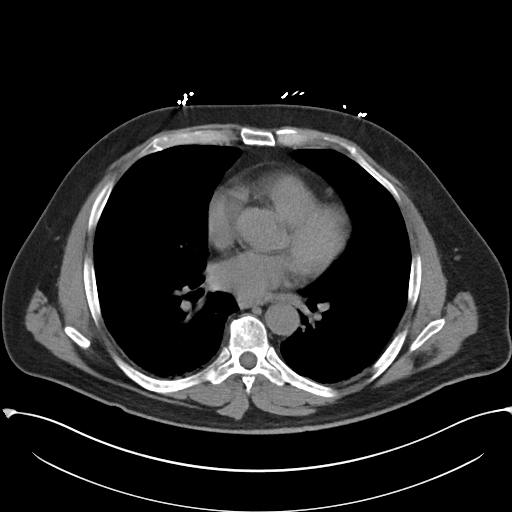

[Series 6: coronal soft tissue · coronal · 0.89mm/px · 3 of 101 slices shown]
[im 34/101  soft-tissue]
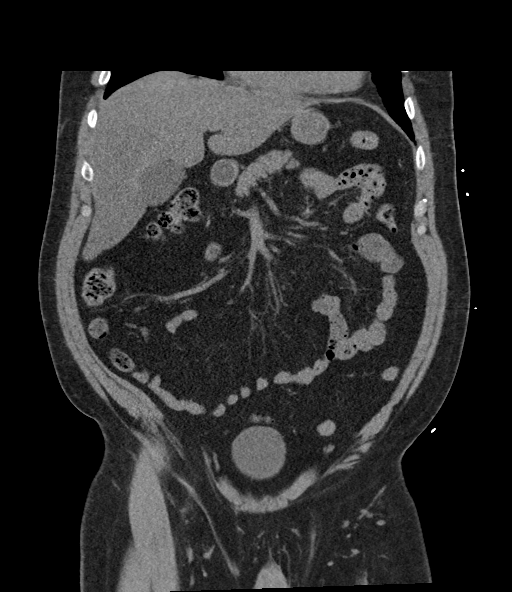
[im 45/101  soft-tissue]
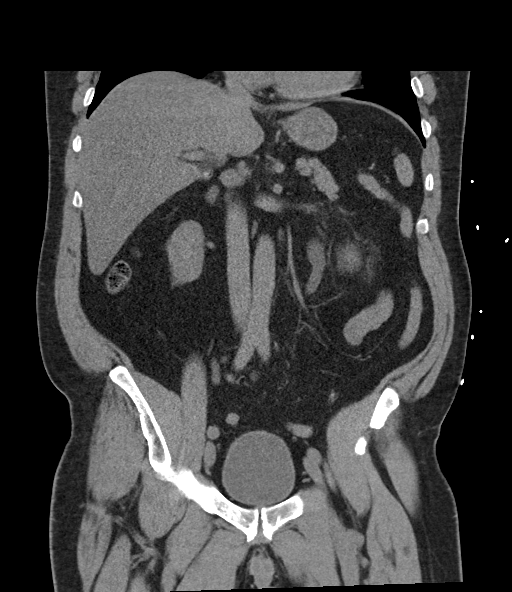
[im 56/101  soft-tissue]
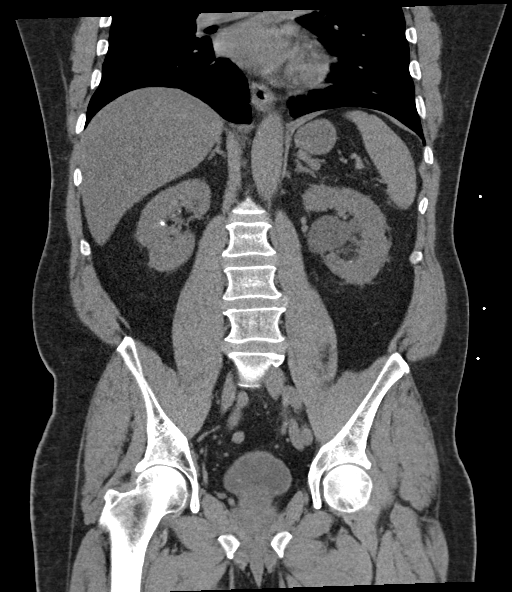

[16 of 46 positions shown; findings below may reference images not displayed]

FINDINGS: Lower chest: Minimal dependent atelectasis of posterior lung bases
are noted. The heart size is normal.

Hepatobiliary: Low-density lesions are identified in the liver
largest measures 2.5 cm unchanged compared prior exam. Gallstones
are noted in the gallbladder. There is no inflammatory change around
the gallbladder. The biliary tree is normal.

Pancreas: Unremarkable. No pancreatic ductal dilatation or
surrounding inflammatory changes.

Spleen: Normal in size without focal abnormality.

Adrenals/Urinary Tract: The bilateral adrenal glands are normal.
There are nonobstructing stones in bilateral kidneys. There is no
right hydronephrosis. There is left hydroureteronephrosis due to
obstruction by a 6 mm stone in the distal left ureter. A small stone
is identified in the bladder lumen.

Stomach/Bowel: Stomach is within normal limits. The appendix is not
seen but no inflammation is noted around cecum. No evidence of bowel
wall thickening, distention, or inflammatory changes.

Vascular/Lymphatic: No significant vascular findings are present. No
enlarged abdominal or pelvic lymph nodes.

Reproductive: Prostate gland is normal.

Other: Right inguinal herniation of mesenteric fat is identified.
Small umbilical herniation of mesenteric fat is noted.

Musculoskeletal: Degenerative joint changes of the spine are noted.
IMPRESSION: Left hydroureteronephrosis due to obstruction by 6 mm stone in the
distal left ureter.

## 2021-03-13 ENCOUNTER — Other Ambulatory Visit: Payer: Self-pay

## 2021-03-13 ENCOUNTER — Other Ambulatory Visit: Payer: Self-pay | Admitting: Podiatry

## 2021-03-13 ENCOUNTER — Ambulatory Visit (INDEPENDENT_AMBULATORY_CARE_PROVIDER_SITE_OTHER): Payer: 59 | Admitting: Podiatry

## 2021-03-13 DIAGNOSIS — R202 Paresthesia of skin: Secondary | ICD-10-CM

## 2021-03-13 DIAGNOSIS — E1359 Other specified diabetes mellitus with other circulatory complications: Secondary | ICD-10-CM

## 2021-03-13 DIAGNOSIS — M792 Neuralgia and neuritis, unspecified: Secondary | ICD-10-CM

## 2021-03-13 DIAGNOSIS — R200 Anesthesia of skin: Secondary | ICD-10-CM

## 2021-03-14 LAB — CBC WITH DIFFERENTIAL/PLATELET
Basophils Absolute: 0.1 x10E3/uL (ref 0.0–0.2)
Basos: 1 %
EOS (ABSOLUTE): 0.8 x10E3/uL — ABNORMAL HIGH (ref 0.0–0.4)
Eos: 8 %
Hematocrit: 42.9 % (ref 37.5–51.0)
Hemoglobin: 14.9 g/dL (ref 13.0–17.7)
Immature Grans (Abs): 0 x10E3/uL (ref 0.0–0.1)
Immature Granulocytes: 0 %
Lymphocytes Absolute: 2.6 x10E3/uL (ref 0.7–3.1)
Lymphs: 28 %
MCH: 30.5 pg (ref 26.6–33.0)
MCHC: 34.7 g/dL (ref 31.5–35.7)
MCV: 88 fL (ref 79–97)
Monocytes Absolute: 0.9 x10E3/uL (ref 0.1–0.9)
Monocytes: 9 %
Neutrophils Absolute: 5.1 x10E3/uL (ref 1.4–7.0)
Neutrophils: 54 %
Platelets: 216 x10E3/uL (ref 150–450)
RBC: 4.89 x10E6/uL (ref 4.14–5.80)
RDW: 12.8 % (ref 11.6–15.4)
WBC: 9.5 x10E3/uL (ref 3.4–10.8)

## 2021-03-14 LAB — BASIC METABOLIC PANEL WITH GFR
BUN/Creatinine Ratio: 20 (ref 10–24)
BUN: 17 mg/dL (ref 8–27)
CO2: 25 mmol/L (ref 20–29)
Calcium: 9.2 mg/dL (ref 8.6–10.2)
Chloride: 103 mmol/L (ref 96–106)
Creatinine, Ser: 0.87 mg/dL (ref 0.76–1.27)
Glucose: 98 mg/dL (ref 65–99)
Potassium: 3.9 mmol/L (ref 3.5–5.2)
Sodium: 141 mmol/L (ref 134–144)
eGFR: 96 mL/min/1.73

## 2021-03-14 LAB — HGB A1C W/O EAG: Hgb A1c MFr Bld: 5.8 % — ABNORMAL HIGH (ref 4.8–5.6)

## 2021-03-17 ENCOUNTER — Encounter: Payer: Self-pay | Admitting: Podiatry

## 2021-03-17 NOTE — Progress Notes (Signed)
Subjective:  Patient ID: John Buck, male    DOB: 1956/10/05,  MRN: [id number]  Chief Complaint  Patient presents with  . Numbness    Numbness in toes     65 y.o. male presents with the above complaint.  Patient presents with complaint of bilateral numbness/tingling in all the toes.  Patient states is gotten worse over the past few weeks.  Patient states it hurts to wear socks.  She he has to wear the socks because it keeps his foot cold.  He is not a diabetic.  He has not had any history of any back pain or sciatica/hip pain.  He wants to get this numbness evaluated it came out of nowhere.  He denies any other acute complaints.  He has not had any history of any nerve compression injury.  He has not had any trauma.   Review of Systems: Negative except as noted in the HPI. Denies N/V/F/Ch.  Past Medical History:  Diagnosis Date  . Arthritis    osteoarthritis  . History of kidney stones   . Hypertension    pt denies    Current Outpatient Medications:  .  acetaminophen (TYLENOL) 500 MG tablet, Take 500 mg by mouth as needed for mild pain. , Disp: , Rfl:  .  amLODipine (NORVASC) 5 MG tablet, Take 5 mg by mouth daily., Disp: , Rfl:  .  aspirin EC 81 MG tablet, Take 81 mg by mouth as needed (non platelet giving weeks)., Disp: , Rfl:  .  colestipol (COLESTID) 1 g tablet, Take 1 g by mouth at bedtime., Disp: , Rfl:  .  glucosamine-chondroitin 500-400 MG tablet, Take 1 tablet by mouth daily as needed (joint discomfort)., Disp: , Rfl:  .  hydrochlorothiazide (HYDRODIURIL) 25 MG tablet, TAKE 1/2 TABLET EVERY DAY (Patient taking differently: TAKE 1/2 TABLET (12.5 mg)EVERY DAY), Disp: 45 tablet, Rfl: 0 .  hydrocortisone (PROCTOSOL HC) 2.5 % rectal cream, INSERT 1 APPLICATION PER RECTUM DAILY AT BEDTIME AS NEEDED (Patient taking differently: Place 1 application rectally as needed for hemorrhoids. INSERT 1 APPLICATION PER RECTUM DAILY AT BEDTIME AS NEEDED), Disp: 28.35 g, Rfl: 3 .  hyoscyamine  (LEVSIN SL) 0.125 MG SL tablet, Place 0.125 mg under the tongue 3 (three) times daily as needed for cramping. , Disp: , Rfl:  .  ibuprofen (ADVIL,MOTRIN) 600 MG tablet, Take 1 tablet (600 mg total) by mouth every 8 (eight) hours as needed., Disp: 15 tablet, Rfl: 0 .  Omega-3 Fatty Acids (FISH OIL) 1000 MG CAPS, Take 1,000 mg by mouth daily., Disp: , Rfl:  .  ondansetron (ZOFRAN ODT) 8 MG disintegrating tablet, Take 1 tablet (8 mg total) by mouth every 8 (eight) hours as needed for nausea or vomiting., Disp: 10 tablet, Rfl: 0 .  oxyCODONE-acetaminophen (PERCOCET/ROXICET) 5-325 MG tablet, Take 1 tablet by mouth every 4 (four) hours as needed for severe pain., Disp: 20 tablet, Rfl: 0 .  Probiotic Product (PROBIOTIC DAILY PO), Take 1 capsule by mouth daily., Disp: , Rfl:  .  tamsulosin (FLOMAX) 0.4 MG CAPS capsule, Take 1 capsule (0.4 mg total) by mouth daily., Disp: 30 capsule, Rfl: 1  Social History   Tobacco Use  Smoking Status Never Smoker  Smokeless Tobacco Never Used    Allergies  Allergen Reactions  . Butorphanol Tartrate     REACTION: Hallucinations  . Hydromorphone Hcl Itching    REACTION: itching   Objective:  There were no vitals filed for this visit. There is no height  or weight on file to calculate BMI. Constitutional Well developed. Well nourished.  Vascular Dorsalis pedis pulses palpable bilaterally. Posterior tibial pulses palpable bilaterally. Capillary refill normal to all digits.  No cyanosis or clubbing noted. Pedal hair growth normal.  Neurologic Normal speech. Oriented to person, place, and time. Decreased sensation to light touch grossly present bilaterally.  Symes monofilament testing shows decreased sensation to all the digital tips up to the metatarsal fat pad.  Protective sensation is not intact.  Negative Tinel's sign.  No tarsal tunnel syndrome.  No common peroneal nerve compression.  Dermatologic Nails within normal limits Skin within normal limits   Orthopedic: Normal joint ROM without pain or crepitus bilaterally. No visible deformities. No bony tenderness.   Radiographs: None Assessment:   1. Numbness and tingling   2. Neuritis    Plan:  Patient was evaluated and treated and all questions answered.  Numbness tingling with underlying neuritis/neuropathy from unknown etiology -I explained the patient the etiology of neuritis and various treatment options were discussed.  This is affecting all the digits likely polyneuropathy is involved.  Patient does not have any history of lower lumbar arthritis that he is aware of or any history of sciatica or nerve impingement.  At this time I believe patient will benefit from EMG/nerve conduction study to rule out any kind of compression to the nerve. -EMG and nerve conduction studies were ordered. -I will also do preliminary blood work for BMP CBC A1c to assess for glucose control/diabetes. -I will see him back again in few weeks to discuss the results.  Patient states understanding. -Patient would like to hold off on gabapentin/Lyrica for neuropathy/neuropathic pain for now.  No follow-ups on file.

## 2021-04-08 ENCOUNTER — Other Ambulatory Visit: Payer: Self-pay | Admitting: Urology

## 2021-04-08 ENCOUNTER — Encounter (HOSPITAL_COMMUNITY)
Admission: AD | Disposition: A | Discharge status: Home / Self Care | Payer: Self-pay | Source: Ambulatory Visit | Attending: Urology

## 2021-04-08 ENCOUNTER — Inpatient Hospital Stay (HOSPITAL_COMMUNITY): Payer: Medicare Other

## 2021-04-08 ENCOUNTER — Ambulatory Visit (HOSPITAL_COMMUNITY)
Admission: AD | Admit: 2021-04-08 | Discharge: 2021-04-08 | Disposition: A | Discharge status: Home / Self Care | Payer: Medicare Other | Source: Ambulatory Visit | Attending: Urology | Admitting: Urology

## 2021-04-08 ENCOUNTER — Encounter (HOSPITAL_COMMUNITY): Payer: Self-pay | Admitting: Urology

## 2021-04-08 DIAGNOSIS — N201 Calculus of ureter: Secondary | ICD-10-CM | POA: Diagnosis present

## 2021-04-08 DIAGNOSIS — Z888 Allergy status to other drugs, medicaments and biological substances status: Secondary | ICD-10-CM | POA: Insufficient documentation

## 2021-04-08 DIAGNOSIS — Z885 Allergy status to narcotic agent status: Secondary | ICD-10-CM | POA: Insufficient documentation

## 2021-04-08 DIAGNOSIS — N132 Hydronephrosis with renal and ureteral calculous obstruction: Secondary | ICD-10-CM | POA: Insufficient documentation

## 2021-04-08 DIAGNOSIS — Z20822 Contact with and (suspected) exposure to covid-19: Secondary | ICD-10-CM | POA: Insufficient documentation

## 2021-04-08 DIAGNOSIS — Z96651 Presence of right artificial knee joint: Secondary | ICD-10-CM | POA: Diagnosis not present

## 2021-04-08 HISTORY — PX: CYSTOSCOPY/URETEROSCOPY/HOLMIUM LASER/STENT PLACEMENT: SHX6546

## 2021-04-08 HISTORY — PX: CYSTOSCOPY W/ RETROGRADES: SHX1426

## 2021-04-08 LAB — SARS CORONAVIRUS 2 BY RT PCR (HOSPITAL ORDER, PERFORMED IN ~~LOC~~ HOSPITAL LAB): SARS Coronavirus 2: NEGATIVE

## 2021-04-08 SURGERY — CYSTOSCOPY/URETEROSCOPY/HOLMIUM LASER/STENT PLACEMENT
Anesthesia: General | Site: Ureter | Laterality: Bilateral

## 2021-04-08 MED ORDER — CEFAZOLIN SODIUM-DEXTROSE 2-4 GM/100ML-% IV SOLN
INTRAVENOUS | Status: AC
  Filled 2021-04-08: qty 100

## 2021-04-08 MED ORDER — LIDOCAINE 2% (20 MG/ML) 5 ML SYRINGE
INTRAMUSCULAR | Status: DC | PRN
  Administered 2021-04-08: 60 mg via INTRAVENOUS

## 2021-04-08 MED ORDER — FENTANYL CITRATE (PF) 100 MCG/2ML IJ SOLN
25.0000 ug | INTRAMUSCULAR | Status: DC | PRN
  Administered 2021-04-08: 50 ug via INTRAVENOUS

## 2021-04-08 MED ORDER — FENTANYL CITRATE (PF) 100 MCG/2ML IJ SOLN
INTRAMUSCULAR | Status: AC
  Filled 2021-04-08: qty 2

## 2021-04-08 MED ORDER — FENTANYL CITRATE (PF) 100 MCG/2ML IJ SOLN
INTRAMUSCULAR | Status: DC | PRN
  Administered 2021-04-08 (×2): 50 ug via INTRAVENOUS

## 2021-04-08 MED ORDER — LACTATED RINGERS IV SOLN: INTRAVENOUS | Status: DC

## 2021-04-08 MED ORDER — CHLORHEXIDINE GLUCONATE 0.12 % MT SOLN: 15.0000 mL | Freq: Once | OROMUCOSAL | Status: DC

## 2021-04-08 MED ORDER — SODIUM CHLORIDE 0.9 % IR SOLN
Status: DC | PRN
  Administered 2021-04-08: 1000 mL

## 2021-04-08 MED ORDER — MIDAZOLAM HCL 5 MG/5ML IJ SOLN
INTRAMUSCULAR | Status: DC | PRN
  Administered 2021-04-08: 2 mg via INTRAVENOUS

## 2021-04-08 MED ORDER — CEPHALEXIN 500 MG PO CAPS: 500.0000 mg | ORAL_CAPSULE | Freq: Two times a day (BID) | ORAL | 0 refills | Status: AC

## 2021-04-08 MED ORDER — ONDANSETRON HCL 4 MG PO TABS: 4.0000 mg | ORAL_TABLET | Freq: Every day | ORAL | 0 refills | Status: AC | PRN

## 2021-04-08 MED ORDER — SODIUM CHLORIDE 0.9 % IR SOLN
Status: DC | PRN
  Administered 2021-04-08: 6000 mL

## 2021-04-08 MED ORDER — EPHEDRINE 5 MG/ML INJ
INTRAVENOUS | Status: AC
  Filled 2021-04-08: qty 10

## 2021-04-08 MED ORDER — PROPOFOL 10 MG/ML IV BOLUS
INTRAVENOUS | Status: DC | PRN
  Administered 2021-04-08: 150 mg via INTRAVENOUS

## 2021-04-08 MED ORDER — CHLORHEXIDINE GLUCONATE 0.12 % MT SOLN
15.0000 mL | OROMUCOSAL | Status: AC
  Administered 2021-04-08: 15 mL via OROMUCOSAL

## 2021-04-08 MED ORDER — ACETAMINOPHEN 500 MG PO TABS
1000.0000 mg | ORAL_TABLET | Freq: Once | ORAL | Status: AC
  Administered 2021-04-08: 1000 mg via ORAL

## 2021-04-08 MED ORDER — DEXAMETHASONE SODIUM PHOSPHATE 10 MG/ML IJ SOLN
INTRAMUSCULAR | Status: DC | PRN
  Administered 2021-04-08: 10 mg via INTRAVENOUS

## 2021-04-08 MED ORDER — SUGAMMADEX SODIUM 200 MG/2ML IV SOLN
INTRAVENOUS | Status: DC | PRN
  Administered 2021-04-08: 200 mg via INTRAVENOUS

## 2021-04-08 MED ORDER — ONDANSETRON HCL 4 MG/2ML IJ SOLN
INTRAMUSCULAR | Status: DC | PRN
  Administered 2021-04-08: 4 mg via INTRAVENOUS

## 2021-04-08 MED ORDER — AMISULPRIDE (ANTIEMETIC) 5 MG/2ML IV SOLN
10.0000 mg | Freq: Once | INTRAVENOUS | Status: AC | PRN
  Administered 2021-04-08: 10 mg via INTRAVENOUS

## 2021-04-08 MED ORDER — IOHEXOL 300 MG/ML  SOLN
INTRAMUSCULAR | Status: DC | PRN
  Administered 2021-04-08: 14 mL via URETHRAL

## 2021-04-08 MED ORDER — ACETAMINOPHEN 500 MG PO TABS
ORAL_TABLET | ORAL | Status: AC
  Filled 2021-04-08: qty 2

## 2021-04-08 MED ORDER — AMISULPRIDE (ANTIEMETIC) 5 MG/2ML IV SOLN
INTRAVENOUS | Status: AC
  Filled 2021-04-08: qty 2

## 2021-04-08 MED ORDER — SUCCINYLCHOLINE CHLORIDE 200 MG/10ML IV SOSY
PREFILLED_SYRINGE | INTRAVENOUS | Status: DC | PRN
  Administered 2021-04-08: 200 mg via INTRAVENOUS

## 2021-04-08 MED ORDER — MIDAZOLAM HCL 2 MG/2ML IJ SOLN
INTRAMUSCULAR | Status: AC
  Filled 2021-04-08: qty 2

## 2021-04-08 MED ORDER — ORAL CARE MOUTH RINSE: 15.0000 mL | Freq: Once | OROMUCOSAL | Status: DC

## 2021-04-08 MED ORDER — ROCURONIUM BROMIDE 10 MG/ML (PF) SYRINGE
PREFILLED_SYRINGE | INTRAVENOUS | Status: DC | PRN
  Administered 2021-04-08: 10 mg via INTRAVENOUS
  Administered 2021-04-08: 30 mg via INTRAVENOUS

## 2021-04-08 SURGICAL SUPPLY — 17 items
BAG URO CATCHER STRL LF (MISCELLANEOUS) ×2 IMPLANT
BASKET STONE 1.7 NGAGE (UROLOGICAL SUPPLIES) ×2 IMPLANT
CATH INTERMIT  6FR 70CM (CATHETERS) ×2 IMPLANT
CLOTH BEACON ORANGE TIMEOUT ST (SAFETY) ×2 IMPLANT
FIBER LASER MOSES 365 DFL (Laser) ×2 IMPLANT
GLOVE BIOGEL M 8.0 STRL (GLOVE) ×4 IMPLANT
GOWN STRL REUS W/ TWL XL LVL3 (GOWN DISPOSABLE) ×1 IMPLANT
GOWN STRL REUS W/TWL XL LVL3 (GOWN DISPOSABLE) ×4 IMPLANT
GUIDEWIRE ANG ZIPWIRE 038X150 (WIRE) ×2 IMPLANT
GUIDEWIRE STR DUAL SENSOR (WIRE) ×6 IMPLANT
KIT TURNOVER KIT A (KITS) ×2 IMPLANT
MANIFOLD NEPTUNE II (INSTRUMENTS) ×2 IMPLANT
PACK CYSTO (CUSTOM PROCEDURE TRAY) ×2 IMPLANT
SHEATH URETERAL 12FRX35CM (MISCELLANEOUS) ×2 IMPLANT
STENT URET 6FRX26 CONTOUR (STENTS) ×4 IMPLANT
TUBING CONNECTING 10 (TUBING) ×2 IMPLANT
TUBING UROLOGY SET (TUBING) ×2 IMPLANT

## 2021-04-08 NOTE — Anesthesia Preprocedure Evaluation (Addendum)
Anesthesia Evaluation  Patient identified by MRN, date of birth, ID band Patient awake    Reviewed: Allergy & Precautions, NPO status , Patient's Chart, lab work & pertinent test results  Airway Mallampati: II  TM Distance: >3 FB Neck ROM: Full    Dental  (+) Teeth Intact, Dental Advisory Given   Pulmonary neg pulmonary ROS,    Pulmonary exam normal breath sounds clear to auscultation       Cardiovascular hypertension, negative cardio ROS Normal cardiovascular exam Rhythm:Regular Rate:Normal     Neuro/Psych negative neurological ROS  negative psych ROS   GI/Hepatic negative GI ROS, Neg liver ROS,   Endo/Other  negative endocrine ROS  Renal/GU negative Renal ROS  negative genitourinary   Musculoskeletal  (+) Arthritis ,   Abdominal   Peds  Hematology negative hematology ROS (+)   Anesthesia Other Findings Bilateral ureteral stones  Reproductive/Obstetrics                            Anesthesia Physical Anesthesia Plan  ASA: II  Anesthesia Plan: General   Post-op Pain Management:    Induction: Intravenous  PONV Risk Score and Plan: 2 and Midazolam, Dexamethasone and Ondansetron  Airway Management Planned: Oral ETT  Additional Equipment:   Intra-op Plan:   Post-operative Plan: Extubation in OR  Informed Consent: I have reviewed the patients History and Physical, chart, labs and discussed the procedure including the risks, benefits and alternatives for the proposed anesthesia with the patient or authorized representative who has indicated his/her understanding and acceptance.     Dental advisory given  Plan Discussed with: CRNA  Anesthesia Plan Comments: (Plan for ETT. Patient last ate solid food at 1200 but has since been having episodes of emesis. )        Anesthesia Quick Evaluation

## 2021-04-08 NOTE — H&P (Signed)
H&P  Chief Complaint: Kidney stones  History of Present Illness:  65 year old male with history of urolithiasis presented earlier today with bilateral flank pain, left side started several days ago, right side started today.  Evaluation revealed bilateral ureteral calculi -left distal stone and right proximal stone.  He presents at this time for ureteroscopic management.  Past Medical History:  Diagnosis Date  . Arthritis    osteoarthritis  . History of kidney stones   . Hypertension    pt denies    Past Surgical History:  Procedure Laterality Date  . EXTRACORPOREAL SHOCK WAVE LITHOTRIPSY Left 03/01/2019   Procedure: EXTRACORPOREAL SHOCK WAVE LITHOTRIPSY (ESWL);  Surgeon: Ceasar Mons, MD;  Location: [facility];  Service: Urology;  Laterality: Left;  . KNEE ARTHROSCOPY W/ ACL RECONSTRUCTION     x2  . TOTAL KNEE ARTHROPLASTY Right 12/27/2016   Procedure: RIGHT TOTAL KNEE ARTHROPLASTY;  Surgeon: Gaynelle Arabian, MD;  Location: [facility];  Service: Orthopedics;  Laterality: Right;    Home Medications:    Allergies:  Allergies  Allergen Reactions  . Butorphanol Tartrate     REACTION: Hallucinations  . Hydromorphone Hcl Itching    REACTION: itching    Family History  Problem Relation Age of Onset  . Hypertension Mother   . Colon cancer Father 70  . Skin cancer Father   . Seizures Father   . Skin cancer Brother   . Hypertension Brother     Social History:  reports that he has never smoked. He has never used smokeless tobacco. He reports that he does not drink alcohol and does not use drugs.  ROS: A complete review of systems was performed.  All systems are negative except for pertinent findings as noted.  Physical Exam:  Vital signs in last 24 hours: BP 134/81   Pulse 62   Temp 98.4 F (36.9 C) (Oral)   Resp 18   Ht 5\' 10"  (1.778 m)   Wt 96 kg   SpO2 94%   BMI 30.36 kg/m  Constitutional:  Alert and oriented, No acute distress Cardiovascular: Regular rate   Respiratory: Normal respiratory effort GI: Abdomen is soft, nontender, nondistended, no abdominal masses. No CVAT.  Genitourinary: Normal male phallus, testes are descended bilaterally and non-tender and without masses, scrotum is normal in appearance without lesions or masses, perineum is normal on inspection. Lymphatic: No lymphadenopathy Neurologic: Grossly intact, no focal deficits Psychiatric: Normal mood and affect  Laboratory Data:  No results for input(s): WBC, HGB, HCT, PLT in the last 72 hours.  No results for input(s): NA, K, CL, GLUCOSE, BUN, CALCIUM, CREATININE in the last 72 hours.  Invalid input(s): CO3   No results found for this or any previous visit (from the past 24 hour(s)). No results found for this or any previous visit (from the past 240 hour(s)).  Renal Function: No results for input(s): CREATININE in the last 168 hours. CrCl cannot be calculated (Patient's most recent lab result is older than the maximum 21 days allowed.).  Radiologic Imaging: No results found.  Impression/Assessment:    Bilateral ureteral stones with hydronephrosis  Plan:   cystoscopy, bilateral retrograde ureteral pyelograms, attempted bilateral ureteroscopy, holmium laser and extraction of calculi

## 2021-04-08 NOTE — Anesthesia Procedure Notes (Signed)
Procedure Name: Intubation Date/Time: 04/08/2021 7:49 PM Performed by: Gerald Leitz, CRNA Pre-anesthesia Checklist: Patient identified, Patient being monitored, Timeout performed, Emergency Drugs available and Suction available Patient Re-evaluated:Patient Re-evaluated prior to induction Oxygen Delivery Method: Circle system utilized Preoxygenation: Pre-oxygenation with 100% oxygen Induction Type: IV induction Ventilation: Mask ventilation without difficulty Laryngoscope Size: Mac and 3 Grade View: Grade I Tube type: Oral Tube size: 7.5 mm Number of attempts: 1 Airway Equipment and Method: Stylet Placement Confirmation: ETT inserted through vocal cords under direct vision,  positive ETCO2 and breath sounds checked- equal and bilateral Secured at: 21 cm Tube secured with: Tape Dental Injury: Teeth and Oropharynx as per pre-operative assessment

## 2021-04-08 NOTE — Op Note (Signed)
Preoperative diagnosis: Bilateral ureteral calculi postoperative diagnosis: Same, with right renal calculi  Principal procedure: Cystoscopy, bilateral retrograde ureteral pyelograms, bilateral ureteroscopy, holmium laser and extraction of left ureteral stones, holmium laser of right ureteral stones (dusting), bilateral double-J stent placement (6 [place] by 26 cm contour with tether's)  Surgeon: Sunshine Mackowski  Anesthesia: General with LMA  Complications: None  Specimen: Stone fragments  Estimated blood loss: Less than 25 mL  Drains: Above-mentioned stent  Indications: 65 year old male with history of urolithiasis presenting earlier today with bilateral ureteral stones.  The patient has had significant pain, nausea and vomiting.  There is no evidence of infection.  CT revealed an approximate 9 mm left distal ureteral stone in the upper pelvic ureter as well as a right upper ureteral stone.  Due to his symptoms, I have recommended we proceed with urgent cystoscopy, retrograde studies, probable ureteroscopy with laser and extraction of stones, double-J stent placement.  Risks and complications of the procedure have been discussed with the patient and his wife.  These include but not limited to blood in the urine, infection, ureteral injury, need for stent placement, stent symptoms among others.  They understand these and desire to proceed.  Findings: Urethra was normal, prostate minimally obstructive.  Bladder inspected circumferentially.  No tumors, trabeculations or foreign bodies.  Both ureteral orifice ease normal in their configuration location.  Upon study of the left ureter with Omnipaque using a 6 [place] open-ended catheter, there was a normal 5 to 6 cm of distal ureter, filling defect at that point with hydroureteronephrosis above that.  No other filling defects were seen.  On the right, the entire mid and distal ureter was normal.  There was a filling defect in the upper/mid ureter with  colonization and hydroureteronephrosis above that.  Description of procedure: The patient was properly identified in the holding area.  He received preoperative IV antibiotics.  Was taken to the operating room where general anesthetic was administered with the LMA.  He was placed in the dorsolithotomy position.  Genitalia and perineum were prepped, draped, proper timeout performed.  Cystoscope was placed under direct vision.  Left retrograde ureteropyelogram was performed with the above-mentioned findings noted.  Guidewire was advanced through the open-ended catheter, through the ureter easily via the stone and into the upper pole calyx where a curl was seen.  The cystoscope and open-ended catheter were then removed.  Sequential dilation of the left distal ureter was first performed with the core and then the entire 12/14 [place], medium length ureteral access catheter.  Following this, that was removed and I then passed the 6 French dual-lumen semirigid ureteroscope.  This was easily passed up to the stone which was quite large and lobulated.  I then took the 70 m fiber and with the holmium laser set at "dusting", the stone was broken into 7-8 smaller fragments as well as multiple sand-like fragments.  The larger of the fragments were easily grasped with the engage basket and brought into the bladder and dropped.  No further stones were seen upon inspection of the entire ureter from the ureterovesical junction to the ureteropelvic junction.  There was minimal trauma to the ureter but I felt stenting was appropriate later on.  The ureteroscope was then removed, the cystoscope replaced and retrograde study was performed of the right ureter with the above-mentioned findings noted.  Guidewire was then easily passed up into the upper pole calyceal system where fluoroscopically a curl was seen.  I then sequentially dilated the ureter  first with the 12 and then the entire 1214 ureteral access catheter.  The core  was then removed as well as the safety wire.  Then passed the flexible ureteroscope up into the upper ureter.  The stone had been flushed up into the renal pelvis where it was seen, fragmented with a 365 m fiber set to the dusting power.  Additionally, I had seen earlier that there were also upper pole stones within the calyx.  The entire pyelocalyceal system was inspected.  Any stone in the calyceal system was also dusted.  There were approximately 3 stones seen, these were all obliterated with a holmium laser.  I felt that these could easily passed once a stent was placed.  The entire pyelocalyceal system was reinspected, no further stones were seen.  The ureteroscope was then removed.  I then replaced the guidewire through the ureteral access catheter and removed the ureteral access catheter.  The scope was then placed over top of the guidewire, and I placed a 26 cm x 6 French contour double-J stent in the right ureter using fluoroscopic and cystoscopic guidance.  Once adequate positioning was seen, the guidewire was removed and the stent was deployed with excellent proximal and distal curl seen.  The tether was left on and brought through the urethra.  The scope was replaced, a guidewire was used to access the left kidney, and once in place the 6 French 26 cm contour double-J stent was advanced.  Once positioned well, the guidewire was removed with excellent proximal and distal curl seen.  The tether was also left on the left-sided stent.  The bladder was drained.  The cystoscope was removed.  The stent tethers were then tied in a knot right outside the urethra, trimmed, and taped to the penis.  At this point the procedure was terminated.  The patient was awakened, taken to the PACU in stable condition, having tolerated procedure well.

## 2021-04-08 NOTE — Discharge Instructions (Signed)
General Anesthesia, Adult, Care After This sheet gives you information about how to care for yourself after your procedure. Your health care provider may also give you more specific instructions. If you have problems or questions, contact your health care provider. What can I expect after the procedure? After the procedure, the following side effects are common: Pain or discomfort at the IV site. Nausea. Vomiting. Sore throat. Trouble concentrating. Feeling cold or chills. Feeling weak or tired. Sleepiness and fatigue. Soreness and body aches. These side effects can affect parts of the body that were not involved in surgery. Follow these instructions at home: For the time period you were told by your health care provider: Rest. Do not participate in activities where you could fall or become injured. Do not drive or use machinery. Do not drink alcohol. Do not take sleeping pills or medicines that cause drowsiness. Do not make important decisions or sign legal documents. Do not take care of children on your own.   Eating and drinking Follow any instructions from your health care provider about eating or drinking restrictions. When you feel hungry, start by eating small amounts of foods that are soft and easy to digest (bland), such as toast. Gradually return to your regular diet. Drink enough fluid to keep your urine pale yellow. If you vomit, rehydrate by drinking water, juice, or clear broth. General instructions If you have sleep apnea, surgery and certain medicines can increase your risk for breathing problems. Follow instructions from your health care provider about wearing your sleep device: Anytime you are sleeping, including during daytime naps. While taking prescription pain medicines, sleeping medicines, or medicines that make you drowsy. Have a responsible adult stay with you for the time you are told. It is important to have someone help care for you until you are awake and  alert. Return to your normal activities as told by your health care provider. Ask your health care provider what activities are safe for you. Take over-the-counter and prescription medicines only as told by your health care provider. If you smoke, do not smoke without supervision. Keep all follow-up visits as told by your health care provider. This is important. Contact a health care provider if: You have nausea or vomiting that does not get better with medicine. You cannot eat or drink without vomiting. You have pain that does not get better with medicine. You are unable to pass urine. You develop a skin rash. You have a fever. You have redness around your IV site that gets worse. Get help right away if: You have difficulty breathing. You have chest pain. You have blood in your urine or stool, or you vomit blood. Summary After the procedure, it is common to have a sore throat or nausea. It is also common to feel tired. Have a responsible adult stay with you for the time you are told. It is important to have someone help care for you until you are awake and alert. When you feel hungry, start by eating small amounts of foods that are soft and easy to digest (bland), such as toast. Gradually return to your regular diet. Drink enough fluid to keep your urine pale yellow. Return to your normal activities as told by your health care provider. Ask your health care provider what activities are safe for you. This information is not intended to replace advice given to you by your health care provider. Make sure you discuss any questions you have with your health care provider. Document Revised: 08/07/2020   Document Reviewed: 03/06/2020 Elsevier Patient Education  2021 [place] may see some blood in the urine and may have some burning with urination for 48-72 hours. You also may notice that you have to urinate more frequently or urgently after your procedure which is normal.  2. You  should call should you develop an inability urinate, fever > 101, persistent nausea and vomiting that prevents you from eating or drinking to stay hydrated.  3. If you have a stent, you will likely urinate more frequently and urgently until the stent is removed and you may experience some discomfort/pain in the lower abdomen and flank especially when urinating. You may take pain medication prescribed to you if needed for pain. You may also intermittently have blood in the urine until the stent is removed.  It is okay to pull on the strings to remove the stents on Monday morning.

## 2021-04-08 NOTE — Transfer of Care (Signed)
Immediate Anesthesia Transfer of Care Note  Patient: John Buck  Procedure(s) Performed: Procedure(s): CYSTOSCOPY/URETEROSCOPY/HOLMIUM LASER/STENT PLACEMENT (Bilateral) CYSTOSCOPY WITH RETROGRADE PYELOGRAM (Bilateral)  Patient Location: PACU  Anesthesia Type:General  Level of Consciousness: Alert, Awake, Oriented  Airway & Oxygen Therapy: Patient Spontanous Breathing  Post-op Assessment: Report given to RN  Post vital signs: Reviewed and stable  Last Vitals:  Vitals:   04/08/21 1702  BP: 134/81  Pulse: 62  Resp: 18  Temp: 36.9 C  SpO2: 81%    Complications: No apparent anesthesia complications

## 2021-04-09 ENCOUNTER — Encounter (HOSPITAL_COMMUNITY): Payer: Self-pay | Admitting: Urology

## 2021-04-10 ENCOUNTER — Ambulatory Visit: Payer: 59 | Admitting: Podiatry

## 2021-04-10 NOTE — Anesthesia Postprocedure Evaluation (Signed)
Anesthesia Post Note  Patient: John Buck  Procedure(s) Performed: CYSTOSCOPY/URETEROSCOPY/HOLMIUM LASER/STENT PLACEMENT (Bilateral Ureter) CYSTOSCOPY WITH RETROGRADE PYELOGRAM (Bilateral Ureter)     Patient location during evaluation: PACU Anesthesia Type: General Level of consciousness: awake and alert Pain management: pain level controlled Vital Signs Assessment: post-procedure vital signs reviewed and stable Respiratory status: spontaneous breathing, nonlabored ventilation, respiratory function stable and patient connected to nasal cannula oxygen Cardiovascular status: blood pressure returned to baseline and stable Postop Assessment: no apparent nausea or vomiting Anesthetic complications: no   No complications documented.  Last Vitals:  Vitals:   04/08/21 2200 04/08/21 2211  BP: 120/85 118/82  Pulse: 82 80  Resp: 20 18  Temp: 36.6 C   SpO2: 94% 97%    Last Pain:  Vitals:   04/08/21 2228  TempSrc:   PainSc: 0-No pain                 Nyazia Canevari L Adaliz Dobis

## 2021-04-14 ENCOUNTER — Encounter (HOSPITAL_COMMUNITY): Payer: Self-pay | Admitting: Urology

## 2021-04-14 ENCOUNTER — Inpatient Hospital Stay (HOSPITAL_COMMUNITY): Payer: Medicare Other | Admitting: Certified Registered Nurse Anesthetist

## 2021-04-14 ENCOUNTER — Ambulatory Visit (HOSPITAL_COMMUNITY)
Admission: RE | Admit: 2021-04-14 | Discharge: 2021-04-14 | Disposition: A | Discharge status: Home / Self Care | Payer: Medicare Other | Source: Ambulatory Visit | Attending: Urology | Admitting: Urology

## 2021-04-14 ENCOUNTER — Inpatient Hospital Stay (HOSPITAL_COMMUNITY): Payer: Medicare Other

## 2021-04-14 ENCOUNTER — Emergency Department (HOSPITAL_COMMUNITY): Payer: Medicare Other

## 2021-04-14 ENCOUNTER — Other Ambulatory Visit: Payer: Self-pay

## 2021-04-14 ENCOUNTER — Emergency Department (HOSPITAL_COMMUNITY)
Admission: EM | Admit: 2021-04-14 | Discharge: 2021-04-14 | Disposition: A | Discharge status: Home / Self Care | Payer: Medicare Other | Source: Home / Self Care | Attending: Emergency Medicine | Admitting: Emergency Medicine

## 2021-04-14 ENCOUNTER — Encounter (HOSPITAL_COMMUNITY): Payer: Self-pay

## 2021-04-14 ENCOUNTER — Other Ambulatory Visit: Payer: Self-pay | Admitting: Urology

## 2021-04-14 ENCOUNTER — Encounter (HOSPITAL_COMMUNITY)
Admission: RE | Disposition: A | Discharge status: Home / Self Care | Payer: Self-pay | Source: Ambulatory Visit | Attending: Urology

## 2021-04-14 DIAGNOSIS — Z7982 Long term (current) use of aspirin: Secondary | ICD-10-CM | POA: Insufficient documentation

## 2021-04-14 DIAGNOSIS — Z20822 Contact with and (suspected) exposure to covid-19: Secondary | ICD-10-CM | POA: Insufficient documentation

## 2021-04-14 DIAGNOSIS — I10 Essential (primary) hypertension: Secondary | ICD-10-CM | POA: Insufficient documentation

## 2021-04-14 DIAGNOSIS — Z885 Allergy status to narcotic agent status: Secondary | ICD-10-CM | POA: Diagnosis not present

## 2021-04-14 DIAGNOSIS — Z87442 Personal history of urinary calculi: Secondary | ICD-10-CM | POA: Insufficient documentation

## 2021-04-14 DIAGNOSIS — Z888 Allergy status to other drugs, medicaments and biological substances status: Secondary | ICD-10-CM | POA: Diagnosis not present

## 2021-04-14 DIAGNOSIS — R112 Nausea with vomiting, unspecified: Secondary | ICD-10-CM | POA: Insufficient documentation

## 2021-04-14 DIAGNOSIS — Z79899 Other long term (current) drug therapy: Secondary | ICD-10-CM | POA: Insufficient documentation

## 2021-04-14 DIAGNOSIS — N200 Calculus of kidney: Secondary | ICD-10-CM

## 2021-04-14 DIAGNOSIS — Z96651 Presence of right artificial knee joint: Secondary | ICD-10-CM | POA: Insufficient documentation

## 2021-04-14 DIAGNOSIS — N132 Hydronephrosis with renal and ureteral calculous obstruction: Secondary | ICD-10-CM | POA: Insufficient documentation

## 2021-04-14 DIAGNOSIS — R109 Unspecified abdominal pain: Secondary | ICD-10-CM | POA: Insufficient documentation

## 2021-04-14 DIAGNOSIS — Z8249 Family history of ischemic heart disease and other diseases of the circulatory system: Secondary | ICD-10-CM | POA: Diagnosis not present

## 2021-04-14 HISTORY — PX: CYSTOSCOPY WITH RETROGRADE PYELOGRAM, URETEROSCOPY AND STENT PLACEMENT: SHX5789

## 2021-04-14 LAB — CBC
HCT: 49.7 % (ref 39.0–52.0)
Hemoglobin: 16.8 g/dL (ref 13.0–17.0)
MCH: 30.2 pg (ref 26.0–34.0)
MCHC: 33.8 g/dL (ref 30.0–36.0)
MCV: 89.2 fL (ref 80.0–100.0)
Platelets: 402 10*3/uL — ABNORMAL HIGH (ref 150–400)
RBC: 5.57 MIL/uL (ref 4.22–5.81)
RDW: 12.2 % (ref 11.5–15.5)
WBC: 16.7 10*3/uL — ABNORMAL HIGH (ref 4.0–10.5)
nRBC: 0 % (ref 0.0–0.2)

## 2021-04-14 LAB — BASIC METABOLIC PANEL
Anion gap: 10 (ref 5–15)
BUN: 41 mg/dL — ABNORMAL HIGH (ref 8–23)
CO2: 28 mmol/L (ref 22–32)
Calcium: 9.4 mg/dL (ref 8.9–10.3)
Chloride: 99 mmol/L (ref 98–111)
Creatinine, Ser: 1.86 mg/dL — ABNORMAL HIGH (ref 0.61–1.24)
GFR, Estimated: 39 mL/min — ABNORMAL LOW (ref >60)
Glucose, Bld: 118 mg/dL — ABNORMAL HIGH (ref 70–99)
Potassium: 3.9 mmol/L (ref 3.5–5.1)
Sodium: 137 mmol/L (ref 135–145)

## 2021-04-14 LAB — RESP PANEL BY RT-PCR (FLU A&B, COVID) ARPGX2
Influenza A by PCR: NEGATIVE
Influenza B by PCR: NEGATIVE
SARS Coronavirus 2 by RT PCR: NEGATIVE

## 2021-04-14 LAB — URINALYSIS, ROUTINE W REFLEX MICROSCOPIC
Bilirubin Urine: NEGATIVE
Glucose, UA: NEGATIVE mg/dL
Ketones, ur: 5 mg/dL — AB
Nitrite: NEGATIVE
Protein, ur: 100 mg/dL — AB
RBC / HPF: >50 RBC/hpf — ABNORMAL HIGH (ref 0–5)
Specific Gravity, Urine: 1.023 (ref 1.005–1.030)
pH: 5 (ref 5.0–8.0)

## 2021-04-14 SURGERY — CYSTOURETEROSCOPY, WITH RETROGRADE PYELOGRAM AND STENT INSERTION
Anesthesia: General | Site: Urethra | Laterality: Bilateral

## 2021-04-14 MED ORDER — ALBUTEROL SULFATE HFA 108 (90 BASE) MCG/ACT IN AERS
2.0000 | INHALATION_SPRAY | RESPIRATORY_TRACT | Status: DC
Start: 2021-04-14 — End: 2021-04-15
  Administered 2021-04-14: 2 via RESPIRATORY_TRACT

## 2021-04-14 MED ORDER — MIDAZOLAM HCL 2 MG/2ML IJ SOLN
INTRAMUSCULAR | Status: AC
  Filled 2021-04-14: qty 2

## 2021-04-14 MED ORDER — MORPHINE SULFATE (PF) 4 MG/ML IV SOLN
4.0000 mg | Freq: Once | INTRAVENOUS | Status: AC
  Administered 2021-04-14: 4 mg via INTRAVENOUS
  Filled 2021-04-14: qty 1

## 2021-04-14 MED ORDER — FENTANYL CITRATE (PF) 100 MCG/2ML IJ SOLN: 25.0000 ug | INTRAMUSCULAR | Status: DC | PRN

## 2021-04-14 MED ORDER — DEXAMETHASONE SODIUM PHOSPHATE 10 MG/ML IJ SOLN
INTRAMUSCULAR | Status: DC | PRN
  Administered 2021-04-14: 5 mg via INTRAVENOUS

## 2021-04-14 MED ORDER — MIDAZOLAM HCL 5 MG/5ML IJ SOLN
INTRAMUSCULAR | Status: DC | PRN
  Administered 2021-04-14: 2 mg via INTRAVENOUS

## 2021-04-14 MED ORDER — ONDANSETRON HCL 4 MG/2ML IJ SOLN: 4.0000 mg | Freq: Once | INTRAMUSCULAR | Status: DC | PRN

## 2021-04-14 MED ORDER — PHENYLEPHRINE 40 MCG/ML (10ML) SYRINGE FOR IV PUSH (FOR BLOOD PRESSURE SUPPORT)
PREFILLED_SYRINGE | INTRAVENOUS | Status: DC | PRN
  Administered 2021-04-14: 80 ug via INTRAVENOUS
  Administered 2021-04-14: 120 ug via INTRAVENOUS
  Administered 2021-04-14 (×2): 80 ug via INTRAVENOUS
  Administered 2021-04-14: 120 ug via INTRAVENOUS

## 2021-04-14 MED ORDER — EPHEDRINE 5 MG/ML INJ
INTRAVENOUS | Status: AC
  Filled 2021-04-14: qty 10

## 2021-04-14 MED ORDER — KETOROLAC TROMETHAMINE 30 MG/ML IJ SOLN
30.0000 mg | Freq: Once | INTRAMUSCULAR | Status: AC
  Administered 2021-04-14: 30 mg via INTRAVENOUS
  Filled 2021-04-14: qty 1

## 2021-04-14 MED ORDER — PHENYLEPHRINE 40 MCG/ML (10ML) SYRINGE FOR IV PUSH (FOR BLOOD PRESSURE SUPPORT)
PREFILLED_SYRINGE | INTRAVENOUS | Status: AC
  Filled 2021-04-14: qty 20

## 2021-04-14 MED ORDER — OXYCODONE-ACETAMINOPHEN 5-325 MG PO TABS: 1.0000 | ORAL_TABLET | Freq: Four times a day (QID) | ORAL | 0 refills | Status: AC | PRN

## 2021-04-14 MED ORDER — CEFAZOLIN SODIUM-DEXTROSE 2-4 GM/100ML-% IV SOLN
2.0000 g | INTRAVENOUS | Status: AC
  Administered 2021-04-14: 2 g via INTRAVENOUS
  Filled 2021-04-14: qty 100

## 2021-04-14 MED ORDER — LACTATED RINGERS IV SOLN: INTRAVENOUS | Status: DC | PRN

## 2021-04-14 MED ORDER — FENTANYL CITRATE (PF) 100 MCG/2ML IJ SOLN
INTRAMUSCULAR | Status: AC
  Filled 2021-04-14: qty 2

## 2021-04-14 MED ORDER — ONDANSETRON HCL 4 MG/2ML IJ SOLN
4.0000 mg | Freq: Once | INTRAMUSCULAR | Status: AC
  Administered 2021-04-14: 4 mg via INTRAVENOUS
  Filled 2021-04-14: qty 2

## 2021-04-14 MED ORDER — PROPOFOL 10 MG/ML IV BOLUS
INTRAVENOUS | Status: DC | PRN
  Administered 2021-04-14: 200 mg via INTRAVENOUS

## 2021-04-14 MED ORDER — FENTANYL CITRATE (PF) 100 MCG/2ML IJ SOLN
INTRAMUSCULAR | Status: DC | PRN
  Administered 2021-04-14 (×2): 25 ug via INTRAVENOUS

## 2021-04-14 MED ORDER — SODIUM CHLORIDE 0.9 % IV BOLUS
500.0000 mL | Freq: Once | INTRAVENOUS | Status: AC
Start: 2021-04-14 — End: 2021-04-14
  Administered 2021-04-14: 500 mL via INTRAVENOUS

## 2021-04-14 MED ORDER — ALBUTEROL SULFATE HFA 108 (90 BASE) MCG/ACT IN AERS
INHALATION_SPRAY | RESPIRATORY_TRACT | Status: AC
  Filled 2021-04-14: qty 6.7

## 2021-04-14 MED ORDER — LIDOCAINE 2% (20 MG/ML) 5 ML SYRINGE
INTRAMUSCULAR | Status: DC | PRN
  Administered 2021-04-14: 80 mg via INTRAVENOUS

## 2021-04-14 MED ORDER — SODIUM CHLORIDE 0.9 % IR SOLN
Status: DC | PRN
  Administered 2021-04-14: 3000 mL

## 2021-04-14 MED ORDER — EPHEDRINE SULFATE-NACL 50-0.9 MG/10ML-% IV SOSY
PREFILLED_SYRINGE | INTRAVENOUS | Status: DC | PRN
  Administered 2021-04-14: 5 mg via INTRAVENOUS

## 2021-04-14 MED ORDER — ACETAMINOPHEN 10 MG/ML IV SOLN: 1000.0000 mg | Freq: Once | INTRAVENOUS | Status: DC | PRN

## 2021-04-14 MED ORDER — IOHEXOL 300 MG/ML  SOLN
INTRAMUSCULAR | Status: DC | PRN
Start: 2021-04-14 — End: 2021-04-15
  Administered 2021-04-14: 50 mL via URETHRAL

## 2021-04-14 MED ORDER — ONDANSETRON HCL 4 MG/2ML IJ SOLN
INTRAMUSCULAR | Status: DC | PRN
  Administered 2021-04-14: 4 mg via INTRAVENOUS

## 2021-04-14 SURGICAL SUPPLY — 21 items
BAG URO CATCHER STRL LF (MISCELLANEOUS) ×2 IMPLANT
BASKET LASER NITINOL 1.9FR (BASKET) IMPLANT
BASKET ZERO TIP NITINOL 2.4FR (BASKET) ×1 IMPLANT
CATH INTERMIT  6FR 70CM (CATHETERS) ×2 IMPLANT
CLOTH BEACON ORANGE TIMEOUT ST (SAFETY) ×2 IMPLANT
EXTRACTOR STONE 1.7FRX115CM (UROLOGICAL SUPPLIES) IMPLANT
GLOVE SURG ENC MOIS LTX SZ7.5 (GLOVE) ×2 IMPLANT
GOWN STRL REUS W/TWL XL LVL3 (GOWN DISPOSABLE) ×2 IMPLANT
GUIDEWIRE ANG ZIPWIRE 038X150 (WIRE) IMPLANT
GUIDEWIRE STR DUAL SENSOR (WIRE) ×3 IMPLANT
KIT TURNOVER KIT A (KITS) ×2 IMPLANT
LASER FIB FLEXIVA PULSE ID 365 (Laser) IMPLANT
MANIFOLD NEPTUNE II (INSTRUMENTS) ×2 IMPLANT
PACK CYSTO (CUSTOM PROCEDURE TRAY) ×2 IMPLANT
SHEATH URETERAL 12FRX28CM (UROLOGICAL SUPPLIES) IMPLANT
SHEATH URETERAL 12FRX35CM (MISCELLANEOUS) IMPLANT
STENT URET 6FRX26 CONTOUR (STENTS) ×2 IMPLANT
TRACTIP FLEXIVA PULS ID 200XHI (Laser) IMPLANT
TRACTIP FLEXIVA PULSE ID 200 (Laser)
TUBING CONNECTING 10 (TUBING) ×2 IMPLANT
TUBING UROLOGY SET (TUBING) ×1 IMPLANT

## 2021-04-14 NOTE — ED Provider Notes (Signed)
[facility] EMERGENCY DEPARTMENT Provider Note   CSN: [id number] Arrival date & time: 04/14/21  0007     History Chief Complaint  Patient presents with  . Flank Pain    John Buck is a 65 y.o. male.  Patient is a 65 year old male with past medical history of hypertension and kidney stones.  Patient recently had bilateral ureteral stents placed due to stones after lithotripsy.  The stents were removed this morning.  Since then, he has had increased pain, primarily on the right with nausea and vomiting.  He feels as though there is another stone there.  He denies any fevers or chills.  The history is provided by the patient.  Flank Pain This is a new problem. The current episode started 12 to 24 hours ago. The problem occurs constantly. The problem has been rapidly worsening. Nothing aggravates the symptoms. Nothing relieves the symptoms. He has tried nothing for the symptoms.       Past Medical History:  Diagnosis Date  . Arthritis    osteoarthritis  . History of kidney stones   . Hypertension    pt denies    Patient Active Problem List   Diagnosis Date Noted  . OA (osteoarthritis) of knee 12/27/2016  . Peripheral edema 02/29/2012  . Keratosis, inflamed seborrheic 05/04/2011  . HEARING LOSS, RIGHT EAR 03/16/2010  . PES PLANUS 04/29/2009  . HYPERTENSION 04/01/2008  . NEPHROLITHIASIS, HX OF 04/01/2008    Past Surgical History:  Procedure Laterality Date  . CYSTOSCOPY W/ RETROGRADES Bilateral 04/08/2021   Procedure: CYSTOSCOPY WITH RETROGRADE PYELOGRAM;  Surgeon: Franchot Gallo, MD;  Location: [facility];  Service: Urology;  Laterality: Bilateral;  . CYSTOSCOPY/URETEROSCOPY/HOLMIUM LASER/STENT PLACEMENT Bilateral 04/08/2021   Procedure: CYSTOSCOPY/URETEROSCOPY/HOLMIUM LASER/STENT PLACEMENT;  Surgeon: Franchot Gallo, MD;  Location: [facility];  Service: Urology;  Laterality: Bilateral;  . EXTRACORPOREAL SHOCK WAVE LITHOTRIPSY Left 03/01/2019    Procedure: EXTRACORPOREAL SHOCK WAVE LITHOTRIPSY (ESWL);  Surgeon: Ceasar Mons, MD;  Location: [facility];  Service: Urology;  Laterality: Left;  . KNEE ARTHROSCOPY W/ ACL RECONSTRUCTION     x2  . TOTAL KNEE ARTHROPLASTY Right 12/27/2016   Procedure: RIGHT TOTAL KNEE ARTHROPLASTY;  Surgeon: Gaynelle Arabian, MD;  Location: [facility];  Service: Orthopedics;  Laterality: Right;       Family History  Problem Relation Age of Onset  . Hypertension Mother   . Colon cancer Father 3  . Skin cancer Father   . Seizures Father   . Skin cancer Brother   . Hypertension Brother     Social History   Tobacco Use  . Smoking status: Never Smoker  . Smokeless tobacco: Never Used  Substance Use Topics  . Alcohol use: Buck  . Drug use: Buck    Home Medications Prior to Admission medications   Medication Sig Start Date End Date Taking? Authorizing Provider  amLODipine (NORVASC) 5 MG tablet Take 5 mg by mouth daily. 01/08/19   [provider]  aspirin EC 81 MG tablet Take 81 mg by mouth daily.    [provider]  cephALEXin (KEFLEX) 500 MG capsule Take 1 capsule (500 mg total) by mouth 2 (two) times daily. 04/08/21   Franchot Gallo, MD  colestipol (COLESTID) 1 g tablet Take 1 g by mouth at bedtime. 01/25/19   [provider]  glucosamine-chondroitin 500-400 MG tablet Take 1 tablet by mouth daily as needed (joint discomfort).    [provider]  hydrochlorothiazide (HYDRODIURIL) 25 MG tablet TAKE 1/2  TABLET EVERY DAY Patient taking differently: Take 12.5 mg by mouth daily. 11/24/15   Dorena Cookey, MD  hyoscyamine (LEVSIN SL) 0.125 MG SL tablet Place 0.125 mg under the tongue 3 (three) times daily as needed for cramping.  01/25/19   [provider]  Omega-3 Fatty Acids (FISH OIL) 1000 MG CAPS Take 1,000 mg by mouth daily.    [provider]  ondansetron (ZOFRAN ODT) 8 MG disintegrating tablet Take 1 tablet (8 mg total) by mouth every 8 (eight)  hours as needed for nausea or vomiting. 02/22/19   Jola Schmidt, MD  ondansetron (ZOFRAN) 4 MG tablet Take 1 tablet (4 mg total) by mouth daily as needed for nausea or vomiting. 04/08/21 04/08/22  Franchot Gallo, MD  oxyCODONE-acetaminophen (PERCOCET/ROXICET) 5-325 MG tablet Take 1 tablet by mouth every 4 (four) hours as needed for severe pain. 02/22/19   Jola Schmidt, MD  zinc gluconate 50 MG tablet Take 50 mg by mouth daily.    [provider]    Allergies    Butorphanol tartrate and Hydromorphone hcl  Review of Systems   Review of Systems  Genitourinary: Positive for flank pain.  All other systems reviewed and are negative.   Physical Exam Updated Vital Signs BP 106/82 (BP Location: Right Arm)   Pulse (!) 112   Temp 98.6 F (37 C) (Oral)   Resp 16   Ht 5\' 10"  (1.778 m)   Wt 96 kg   SpO2 99%   BMI 30.37 kg/m   Physical Exam Vitals and nursing note reviewed.  Constitutional:      General: He is not in acute distress.    Appearance: He is well-developed. He is not diaphoretic.  HENT:     Head: Normocephalic and atraumatic.  Cardiovascular:     Rate and Rhythm: Normal rate and regular rhythm.     Heart sounds: Buck murmur heard. Buck friction rub.  Pulmonary:     Effort: Pulmonary effort is normal. Buck respiratory distress.     Breath sounds: Normal breath sounds. Buck wheezing or rales.  Abdominal:     General: Bowel sounds are normal. There is Buck distension.     Palpations: Abdomen is soft.     Tenderness: There is Buck abdominal tenderness. There is right CVA tenderness. There is Buck left CVA tenderness, guarding or rebound.  Musculoskeletal:        General: Normal range of motion.     Cervical back: Normal range of motion and neck supple.  Skin:    General: Skin is warm and dry.  Neurological:     Mental Status: He is alert and oriented to person, place, and time.     Coordination: Coordination normal.     ED Results / Procedures / Treatments   Labs (all  labs ordered are listed, but only abnormal results are displayed) Labs Reviewed  CBC - Abnormal; Notable for the following components:      Result Value   WBC 16.7 (*)    Platelets 402 (*)    All other components within normal limits  URINE CULTURE  URINALYSIS, ROUTINE W REFLEX MICROSCOPIC  BASIC METABOLIC PANEL    EKG None  Radiology Buck results found.  Procedures Procedures   Medications Ordered in ED Medications  sodium chloride 0.9 % bolus 500 mL (has Buck administration in time range)  ondansetron (ZOFRAN) injection 4 mg (has Buck administration in time range)  morphine 4 MG/ML injection 4 mg (has Buck administration in time  range)  ketorolac (TORADOL) 30 MG/ML injection 30 mg (has Buck administration in time range)    ED Course  I have reviewed the triage vital signs and the nursing notes.  Pertinent labs & imaging results that were available during my care of the patient were reviewed by me and considered in my medical decision making (see chart for details).    MDM Rules/Calculators/A&P  Patient with history of bilateral renal calculi status post lithotripsy and stenting performed by Dr. Diona Fanti.  Patient removed his stents this morning, then developed severe right flank pain.  He feels as though there is another stone.  Laboratory studies reveal hematuria with Buck convincing evidence for infection.  His creatinine is 1.9 which is an increase above his baseline.  I did obtain a CT scan which showed a 7 mm stone in the distal right ureter which appears to have shifted from its previous position within the kidney.  The above situation was discussed with Dr. Tresa Moore from urology.  He will make arrangements for the patient to follow-up in the office today or tomorrow.  Patient is otherwise afebrile with Buck tachycardia and does not appear toxic or infected.  He is feeling significantly improved after medications given in the ER.  Patient to be discharged with pain medicine and  follow-up as previously stated.  Final Clinical Impression(s) / ED Diagnoses Final diagnoses:  None    Rx / DC Orders ED Discharge Orders    None       Veryl Speak, MD 04/14/21 [phone]

## 2021-04-14 NOTE — Transfer of Care (Signed)
Immediate Anesthesia Transfer of Care Note  Patient: John Buck  Procedure(s) Performed: CYSTOSCOPY WITH RETROGRADE PYELOGRAM, URETEROSCOPY AND STENT PLACEMENT (Bilateral Urethra)  Patient Location: PACU  Anesthesia Type:General  Level of Consciousness: drowsy and patient cooperative  Airway & Oxygen Therapy: Patient Spontanous Breathing and Patient connected to face mask oxygen  Post-op Assessment: Report given to RN and Post -op Vital signs reviewed and stable  Post vital signs: Reviewed and stable  Last Vitals:  Vitals Value Taken Time  BP 128/84 04/14/21 1950  Temp    Pulse 89 04/14/21 1952  Resp 20 04/14/21 1952  SpO2 100 % 04/14/21 1952  Vitals shown include unvalidated device data.  Last Pain:  Vitals:   04/14/21 1457  TempSrc: Oral  PainSc:          Complications: No complications documented.

## 2021-04-14 NOTE — H&P (Signed)
H&P  Chief Complaint: Bilateral hydronephrosis and ureteral calculi  History of Present Illness: 65 year old male recently underwent bilateral ureteroscopy with bilateral stent placement presented after removing his stents and had a CT scan performed that showed a distal right ureteral calculus with upstream hydronephrosis.  He also had severe left-sided hydroureteronephrosis down to the level of some stones at the ureterovesicular junction versus in the bladder.  He presents for bilateral ureteroscopy with laser lithotripsy and ureteral stent placements.  I performed a urine PCR in the office prior to the procedure and this was negative for any infection which I was potentially concerned about given his leukocytosis and dysuria.  Past Medical History:  Diagnosis Date  . Arthritis    osteoarthritis  . History of kidney stones   . Hypertension    pt denies   Past Surgical History:  Procedure Laterality Date  . CYSTOSCOPY W/ RETROGRADES Bilateral 04/08/2021   Procedure: CYSTOSCOPY WITH RETROGRADE PYELOGRAM;  Surgeon: Franchot Gallo, MD;  Location: [facility];  Service: Urology;  Laterality: Bilateral;  . CYSTOSCOPY/URETEROSCOPY/HOLMIUM LASER/STENT PLACEMENT Bilateral 04/08/2021   Procedure: CYSTOSCOPY/URETEROSCOPY/HOLMIUM LASER/STENT PLACEMENT;  Surgeon: Franchot Gallo, MD;  Location: [facility];  Service: Urology;  Laterality: Bilateral;  . EXTRACORPOREAL SHOCK WAVE LITHOTRIPSY Left 03/01/2019   Procedure: EXTRACORPOREAL SHOCK WAVE LITHOTRIPSY (ESWL);  Surgeon: Ceasar Mons, MD;  Location: [facility];  Service: Urology;  Laterality: Left;  . KNEE ARTHROSCOPY W/ ACL RECONSTRUCTION     x2  . TOTAL KNEE ARTHROPLASTY Right 12/27/2016   Procedure: RIGHT TOTAL KNEE ARTHROPLASTY;  Surgeon: Gaynelle Arabian, MD;  Location: [facility];  Service: Orthopedics;  Laterality: Right;    Home Medications:  Medications Prior to Admission  Medication Sig Dispense Refill Last Dose  . amLODipine (NORVASC) 5 MG  tablet Take 5 mg by mouth daily.   04/14/2021 at Unknown time  . aspirin EC 81 MG tablet Take 81 mg by mouth daily.   04/14/2021 at Unknown time  . cephALEXin (KEFLEX) 500 MG capsule Take 1 capsule (500 mg total) by mouth 2 (two) times daily. 10 capsule 0 Past Week at Unknown time  . colestipol (COLESTID) 1 g tablet Take 1 g by mouth at bedtime.   04/14/2021 at Unknown time  . glucosamine-chondroitin 500-400 MG tablet Take 1 tablet by mouth daily as needed (joint discomfort).   Past Week at Unknown time  . hydrochlorothiazide (HYDRODIURIL) 25 MG tablet TAKE 1/2 TABLET EVERY DAY (Patient taking differently: Take 12.5 mg by mouth daily.) 45 tablet 0 04/13/2021 at Unknown time  . Omega-3 Fatty Acids (FISH OIL) 1000 MG CAPS Take 1,000 mg by mouth daily.   Past Week at Unknown time  . ondansetron (ZOFRAN ODT) 8 MG disintegrating tablet Take 1 tablet (8 mg total) by mouth every 8 (eight) hours as needed for nausea or vomiting. 10 tablet 0 Past Week at Unknown time  . ondansetron (ZOFRAN) 4 MG tablet Take 1 tablet (4 mg total) by mouth daily as needed for nausea or vomiting. 10 tablet 0 Past Week at Unknown time  . oxyCODONE-acetaminophen (PERCOCET) 5-325 MG tablet Take 1-2 tablets by mouth every 6 (six) hours as needed. 20 tablet 0 04/14/2021 at Unknown time  . zinc gluconate 50 MG tablet Take 50 mg by mouth daily.   04/13/2021 at Unknown time  . hyoscyamine (LEVSIN SL) 0.125 MG SL tablet Place 0.125 mg under the tongue 3 (three) times daily as needed for cramping.       Allergies:  Allergies  Allergen Reactions  .  Butorphanol Tartrate     REACTION: Hallucinations  . Hydromorphone Hcl Itching    REACTION: itching    Family History  Problem Relation Age of Onset  . Hypertension Mother   . Colon cancer Father 55  . Skin cancer Father   . Seizures Father   . Skin cancer Brother   . Hypertension Brother    Social History:  reports that he has never smoked. He has never used smokeless tobacco. He reports  that he does not drink alcohol and does not use drugs.  ROS: A complete review of systems was performed.  All systems are negative except for pertinent findings as noted. ROS   Physical Exam:  Vital signs in last 24 hours: Temp:  [97.7 F (36.5 C)-98.6 F (37 C)] 98.6 F (37 C) (05/10 1457) Pulse Rate:  [61-112] 90 (05/10 1457) Resp:  [15-22] 18 (05/10 1457) BP: (105-144)/(69-106) 107/69 (05/10 1457) SpO2:  [94 %-99 %] 94 % (05/10 1457) Weight:  [92 kg-96 kg] 92 kg (05/10 1444) General:  Alert and oriented, No acute distress HEENT: Normocephalic, atraumatic Neck: No JVD or lymphadenopathy Cardiovascular: Regular rate and rhythm Lungs: Regular rate and effort Abdomen: Soft, nontender, nondistended, no abdominal masses Back: No CVA tenderness Extremities: No edema Neurologic: Grossly intact  Laboratory Data:  Results for orders placed or performed during the hospital encounter of 04/14/21 (from the past 24 hour(s))  Resp Panel by RT-PCR (Flu A&B, Covid) Nasopharyngeal Swab     Status: None   Collection Time: 04/14/21  2:29 PM   Specimen: Nasopharyngeal Swab; Nasopharyngeal(NP) swabs in vial transport medium  Result Value Ref Range   SARS Coronavirus 2 by RT PCR NEGATIVE NEGATIVE   Influenza A by PCR NEGATIVE NEGATIVE   Influenza B by PCR NEGATIVE NEGATIVE   Recent Results (from the past 240 hour(s))  SARS Coronavirus 2 by RT PCR (hospital order, performed in [facility] hospital lab) Nasopharyngeal Nasopharyngeal Swab     Status: None   Collection Time: 04/08/21  4:53 PM   Specimen: Nasopharyngeal Swab  Result Value Ref Range Status   SARS Coronavirus 2 NEGATIVE NEGATIVE Final    Comment: (NOTE) SARS-CoV-2 target nucleic acids are NOT DETECTED.  The SARS-CoV-2 RNA is generally detectable in upper and lower respiratory specimens during the acute phase of infection. The lowest concentration of SARS-CoV-2 viral copies this assay can detect is 250 copies / mL. A negative  result does not preclude SARS-CoV-2 infection and should not be used as the sole basis for treatment or other patient management decisions.  A negative result may occur with improper specimen collection / handling, submission of specimen other than nasopharyngeal swab, presence of viral mutation(s) within the areas targeted by this assay, and inadequate number of viral copies (<250 copies / mL). A negative result must be combined with clinical observations, patient history, and epidemiological information.  Fact Sheet for Patients:   StrictlyIdeas.no  Fact Sheet for Healthcare Providers: BankingDealers.co.za  This test is not yet approved or  cleared by the [place] FDA and has been authorized for detection and/or diagnosis of SARS-CoV-2 by FDA under an Emergency Use Authorization (EUA).  This EUA will remain in effect (meaning this test can be used) for the duration of the COVID-19 declaration under Section 564(b)(1) of the Act, 21 U.S.C. section 360bbb-3(b)(1), unless the authorization is terminated or revoked sooner.  Performed at [facility], [place] [address] [place], [place] [address]   Resp Panel by RT-PCR (Flu A&B,  Covid) Nasopharyngeal Swab     Status: None   Collection Time: 04/14/21  2:29 PM   Specimen: Nasopharyngeal Swab; Nasopharyngeal(NP) swabs in vial transport medium  Result Value Ref Range Status   SARS Coronavirus 2 by RT PCR NEGATIVE NEGATIVE Final    Comment: (NOTE) SARS-CoV-2 target nucleic acids are NOT DETECTED.  The SARS-CoV-2 RNA is generally detectable in upper respiratory specimens during the acute phase of infection. The lowest concentration of SARS-CoV-2 viral copies this assay can detect is 138 copies/mL. A negative result does not preclude SARS-Cov-2 infection and should not be used as the sole basis for treatment or other patient management decisions. A negative result may  occur with  improper specimen collection/handling, submission of specimen other than nasopharyngeal swab, presence of viral mutation(s) within the areas targeted by this assay, and inadequate number of viral copies(<138 copies/mL). A negative result must be combined with clinical observations, patient history, and epidemiological information. The expected result is Negative.  Fact Sheet for Patients:  EntrepreneurPulse.com.au  Fact Sheet for Healthcare Providers:  IncredibleEmployment.be  This test is no t yet approved or cleared by the [place] FDA and  has been authorized for detection and/or diagnosis of SARS-CoV-2 by FDA under an Emergency Use Authorization (EUA). This EUA will remain  in effect (meaning this test can be used) for the duration of the COVID-19 declaration under Section 564(b)(1) of the Act, 21 U.S.C.section 360bbb-3(b)(1), unless the authorization is terminated  or revoked sooner.       Influenza A by PCR NEGATIVE NEGATIVE Final   Influenza B by PCR NEGATIVE NEGATIVE Final    Comment: (NOTE) The Xpert Xpress SARS-CoV-2/FLU/RSV plus assay is intended as an aid in the diagnosis of influenza from Nasopharyngeal swab specimens and should not be used as a sole basis for treatment. Nasal washings and aspirates are unacceptable for Xpert Xpress SARS-CoV-2/FLU/RSV testing.  Fact Sheet for Patients: EntrepreneurPulse.com.au  Fact Sheet for Healthcare Providers: IncredibleEmployment.be  This test is not yet approved or cleared by the [place] FDA and has been authorized for detection and/or diagnosis of SARS-CoV-2 by FDA under an Emergency Use Authorization (EUA). This EUA will remain in effect (meaning this test can be used) for the duration of the COVID-19 declaration under Section 564(b)(1) of the Act, 21 U.S.C. section 360bbb-3(b)(1), unless the authorization is terminated  or revoked.  Performed at [facility], [place] [address], [place], [place] [address]    Creatinine: Recent Labs    04/14/21 0024  CREATININE 1.86*    Impression/Assessment:  Bilateral hydronephrosis and ureteral calculi  Plan:  Proceed with cystoscopy with bilateral ureteroscopy, laser lithotripsy, ureteral stent exchange  Marton Redwood, III 04/14/2021, 6:48 PM

## 2021-04-14 NOTE — Op Note (Signed)
Operative Note  Preoperative diagnosis:  1.  Bilateral hydronephrosis, possible bilateral ureteral calculi  Postoperative diagnosis: 1.  Bilateral hydronephrosis, right ureteral calculi  Procedure(s): 1.  Cystoscopy with bilateral retrograde pyelogram, right ureteroscopy with stone extraction, left diagnostic ureteroscopy, bilateral ureteral stent placement  Surgeon: Link Snuffer, MD  Assistants: None  Anesthesia: General  Complications: None immediate  EBL: Minimal  Specimens: 1.  None  Drains/Catheters: 1.  Bilateral 6 x 26 double-J ureteral stent  Intraoperative findings: 1.  Normal anterior urethra 2.  Borderline obstructing prostate 3.  Bladder mucosa with some trabeculation.  There were multiple stones layering the bladder that were evacuated. 4.  Right ureteroscopy revealed several small fragments up to 4 mm in size that were basket extracted.  Remainder of ureter was clear after extraction.  Retrograde pyelogram revealed some mild to moderate hydronephrosis. 5.  Diagnostic left ureteroscopy revealed no stones.  There was ureteral inflammation.  There was severe hydronephrosis on the left  Indication: 65 year old male recently underwent bilateral ureteroscopy with laser lithotripsy and stent placement.  He removed his stents and presented to the emergency department with severe bilateral flank pain.  CT scan showed distal right ureteral calculi and severe bilateral hydronephrosis.  There were also calcifications around the area of the left ureterovesicular junction.  Decision was made to proceed with the above operation.  Description of procedure:  The patient was identified and consent was obtained.  The patient was taken to the operating room and placed in the supine position.  The patient was placed under general anesthesia.  Perioperative antibiotics were administered.  The patient was placed in dorsal lithotomy.  Patient was prepped and draped in a standard sterile  fashion and a timeout was performed.  A 21 French rigid cystoscope was advanced into the urethra and into the bladder.  Complete cystoscopy was performed with the findings noted above.  I evacuated all of the stones that were within the bladder.  I then passed a sensor wire up the right ureter and into the kidney under fluoroscopic guidance.  Semirigid ureteroscopy was performed and several small stone fragments were encountered in the distal ureter.  These were basket extracted.  I reinspected the ureter and it was completely clear up to the level of the renal pelvis.  I shot a retrograde pyelogram through the scope with findings noted above.  I then withdrew the scope and carefully advanced the scope up the left ureter which was easily navigated.  There was ureteral inflammation but no obvious stone up to the renal pelvis.  I shot a retrograde pyelogram through the scope.  I advanced a sensor wire through the scope and into the kidney and withdrew the scope.  I backloaded the left wire onto the rigid cystoscope and advanced that into the bladder followed by routine placement of a 6 x 26 double-J ureteral stent.  Fluoroscopy confirmed proximal placement and direct visualization confirmed a good coil within the bladder.  I withdrew the scope and backloaded the right sensor wire onto the rigid cystoscope and advanced that into the bladder followed by routine placement of a 6 x 26 double-J ureteral stent fluoroscopy confirmed proximal placement and direct visualization again confirmed good coil within the bladder bilaterally.  I drained the bladder and withdrew the scope.  Patient tolerated the procedure well was stable postoperatively.  Plan: Return in 1 week for stent removal

## 2021-04-14 NOTE — Anesthesia Preprocedure Evaluation (Signed)
Anesthesia Evaluation  Patient identified by MRN, date of birth, ID band Patient awake    Reviewed: Allergy & Precautions, NPO status , Patient's Chart, lab work & pertinent test results  Airway Mallampati: II  TM Distance: >3 FB Neck ROM: Full    Dental  (+) Teeth Intact, Dental Advisory Given   Pulmonary neg pulmonary ROS,    Pulmonary exam normal        Cardiovascular hypertension, Pt. on medications Normal cardiovascular exam     Neuro/Psych negative neurological ROS  negative psych ROS   GI/Hepatic negative GI ROS, Neg liver ROS,   Endo/Other  negative endocrine ROS  Renal/GU negative Renal ROS  negative genitourinary   Musculoskeletal  (+) Arthritis ,   Abdominal Normal abdominal exam  (+)   Peds  Hematology negative hematology ROS (+)   Anesthesia Other Findings Bilateral ureteral stones  Reproductive/Obstetrics                             Anesthesia Physical  Anesthesia Plan  ASA: II  Anesthesia Plan: General   Post-op Pain Management:    Induction: Intravenous  PONV Risk Score and Plan: 4 or greater and Midazolam, Dexamethasone and Ondansetron  Airway Management Planned: LMA  Additional Equipment: None  Intra-op Plan:   Post-operative Plan: Extubation in OR  Informed Consent: I have reviewed the patients History and Physical, chart, labs and discussed the procedure including the risks, benefits and alternatives for the proposed anesthesia with the patient or authorized representative who has indicated his/her understanding and acceptance.     Dental advisory given  Plan Discussed with: CRNA  Anesthesia Plan Comments:         Anesthesia Quick Evaluation

## 2021-04-14 NOTE — ED Triage Notes (Signed)
Right flank pain today. Pt had recent stents placed in for kidney stones. Pt very nauseous and low appetite.    Pt states he had stents taken out yesterday and now feels like there's another kidney stone.

## 2021-04-14 NOTE — Anesthesia Procedure Notes (Signed)
Procedure Name: LMA Insertion Performed by: Gean Maidens, CRNA Pre-anesthesia Checklist: Patient identified, Emergency Drugs available, Suction available, Patient being monitored and Timeout performed Patient Re-evaluated:Patient Re-evaluated prior to induction Oxygen Delivery Method: Circle system utilized Preoxygenation: Pre-oxygenation with 100% oxygen Induction Type: IV induction Ventilation: Mask ventilation without difficulty LMA: LMA inserted LMA Size: 4.0 Number of attempts: 1 Airway Equipment and Method: Stylet Placement Confirmation: positive ETCO2 and breath sounds checked- equal and bilateral Tube secured with: Tape Dental Injury: Teeth and Oropharynx as per pre-operative assessment

## 2021-04-14 NOTE — Discharge Instructions (Addendum)
Albuterol Metered Dose Inhaler (MDI) What is this medicine? ALBUTEROL (al BYOO ter ole) is a bronchodilator. It treats bronchospasm. Bronchospasm is when you have trouble breathing and make loud or whistling sounds when you breathe. This drug opens the airways in the lungs so it is easier to breathe. This medicine may be used for other purposes; ask your health care provider or pharmacist if you have questions. COMMON BRAND NAME(S): Proair HFA, Proventil, Proventil HFA, Respirol, Ventolin, Ventolin HFA What should I tell my health care provider before I take this medicine? They need to know if you have any of the following conditions:  diabetes (high blood sugar)  heart disease  high blood pressure  irregular heartbeat or rhythm  pheochromocytoma  seizures  thyroid disease  an unusual or allergic reaction to albuterol, levalbuterol, other medicines, foods, dyes, or preservatives  pregnant or trying to get pregnant  breast-feeding How should I use this medicine? This medicine is inhaled through the mouth. Take it as directed on the prescription label. Do not use it more often than directed. This medicine comes with INSTRUCTIONS FOR USE. Ask your pharmacist for directions on how to use this medicine. Read the information carefully. Talk to your pharmacist or health care provider if you have questions. Talk to your health care provider about the use of this medicine in children. While it may be given to children for selected conditions, precautions do apply. Overdosage: If you think you have taken too much of this medicine contact a poison control center or emergency room at once. NOTE: This medicine is only for you. Do not share this medicine with others. What if I miss a dose? If you take this medication on a regular basis, take it as soon as you can. If it is almost time for your next dose, take only that dose. Do not take double or extra doses. What may interact with this  medicine?  anti-infectives like chloroquine and pentamidine  caffeine  cisapride  diuretics  medicines for colds  medicines for depression or for emotional or psychotic conditions  medicines for weight loss including some herbal products  methadone  some antibiotics like clarithromycin, erythromycin, levofloxacin, and linezolid  some heart medicines  steroid hormones like dexamethasone, cortisone, hydrocortisone  theophylline  thyroid hormones This list may not describe all possible interactions. Give your health care provider a list of all the medicines, herbs, non-prescription drugs, or dietary supplements you use. Also tell them if you smoke, drink alcohol, or use illegal drugs. Some items may interact with your medicine. What should I watch for while using this medicine? Visit your health care provider for regular checks on your progress. Tell your health care provider if your symptoms do not start to get better or if they get worse. If your symptoms get worse or if you are using this drug more than normal, call your health care provider right away. Do not treat yourself for coughs, colds or allergies without asking your health care provider for advice. Some nonprescription medicines can affect this one. You and your health care provider should develop an Asthma Action Plan that is just for you. Be sure to know what to do if you are in the yellow (asthma is getting worse) or red (medical alert) zones. Your mouth may get dry. Chewing sugarless gum or sucking hard candy and drinking plenty of water may help. Contact your health care provider if the problem does not go away or is severe. What side effects may I   notice from receiving this medicine? Side effects that you should report to your doctor or health care professional as soon as possible:  allergic reactions (skin rash, itching or hives; swelling of the face, lips, or tongue)  fever  heartbeat rhythm changes (trouble  breathing; chest pain; dizziness; fast, irregular heartbeat; feeling faint or lightheaded, falls)  increase in blood pressure  muscle cramps, pain  muscle weakness  pain, tingling, numbness in the hands or feet  vomiting Side effects that usually do not require medical attention (report to your doctor or health care professional if they continue or are bothersome):  change in taste  cough  dry mouth  headache  nasal congestion (runny or stuffy nose)  sore throat  tremors  trouble sleeping  upset stomach This list may not describe all possible side effects. Call your doctor for medical advice about side effects. You may report side effects to FDA at 1-800-FDA-1088. Where should I keep my medicine? Keep out of the reach of children and pets. Store at room temperature between 20 and 25 degrees C (68 and 77 degrees F). Keep inhaler away from extreme heat and cold. Get rid of it when the dose counter reads 0 or after the expiration date, whichever is first. NOTE: This sheet is a summary. It may not cover all possible information. If you have questions about this medicine, talk to your doctor, pharmacist, or health care provider.  2021 Elsevier/Gold Standard (2020-11-07 10:51:44) General Anesthesia, Adult, Care After This sheet gives you information about how to care for yourself after your procedure. Your health care provider may also give you more specific instructions. If you have problems or questions, contact your health care provider. What can I expect after the procedure? After the procedure, the following side effects are common:  Pain or discomfort at the IV site.  Nausea.  Vomiting.  Sore throat.  Trouble concentrating.  Feeling cold or chills.  Feeling weak or tired.  Sleepiness and fatigue.  Soreness and body aches. These side effects can affect parts of the body that were not involved in surgery. Follow these instructions at home: For the time period  you were told by your health care provider:  Rest.  Do not participate in activities where you could fall or become injured.  Do not drive or use machinery.  Do not drink alcohol.  Do not take sleeping pills or medicines that cause drowsiness.  Do not make important decisions or sign legal documents.  Do not take care of children on your own.   Eating and drinking  Follow any instructions from your health care provider about eating or drinking restrictions.  When you feel hungry, start by eating small amounts of foods that are soft and easy to digest (bland), such as toast. Gradually return to your regular diet.  Drink enough fluid to keep your urine pale yellow.  If you vomit, rehydrate by drinking water, juice, or clear broth. General instructions  If you have sleep apnea, surgery and certain medicines can increase your risk for breathing problems. Follow instructions from your health care provider about wearing your sleep device: ? Anytime you are sleeping, including during daytime naps. ? While taking prescription pain medicines, sleeping medicines, or medicines that make you drowsy.  Have a responsible adult stay with you for the time you are told. It is important to have someone help care for you until you are awake and alert.  Return to your normal activities as told by your health  care provider. Ask your health care provider what activities are safe for you.  Take over-the-counter and prescription medicines only as told by your health care provider.  If you smoke, do not smoke without supervision.  Keep all follow-up visits as told by your health care provider. This is important. Contact a health care provider if:  You have nausea or vomiting that does not get better with medicine.  You cannot eat or drink without vomiting.  You have pain that does not get better with medicine.  You are unable to pass urine.  You develop a skin rash.  You have a  fever.  You have redness around your IV site that gets worse. Get help right away if:  You have difficulty breathing.  You have chest pain.  You have blood in your urine or stool, or you vomit blood. Summary  After the procedure, it is common to have a sore throat or nausea. It is also common to feel tired.  Have a responsible adult stay with you for the time you are told. It is important to have someone help care for you until you are awake and alert.  When you feel hungry, start by eating small amounts of foods that are soft and easy to digest (bland), such as toast. Gradually return to your regular diet.  Drink enough fluid to keep your urine pale yellow.  Return to your normal activities as told by your health care provider. Ask your health care provider what activities are safe for you. This information is not intended to replace advice given to you by your health care provider. Make sure you discuss any questions you have with your health care provider. Document Revised: 08/07/2020 Document Reviewed: 03/06/2020 Elsevier Patient Education  2021 [facility] [id number] Post Ureteroscopy With or Without Stent Instructions  Definitions:  Ureter: The duct that transports urine from the kidney to the bladder. Stent:   A plastic hollow tube that is placed into the ureter, from the kidney to the                 bladder to prevent the ureter from swelling shut.  GENERAL INSTRUCTIONS:  Despite the fact that no skin incisions were used, the area around the ureter and bladder is raw and irritated. The stent is a foreign body which will further irritate the bladder wall. This irritation is manifested by increased frequency of urination, both day and night, and by an increase in the urge to urinate. In some, the urge to urinate is present almost always. Sometimes the urge is strong enough that you may not be able to stop yourself from urinating. The only  real cure is to remove the stent and then give time for the bladder wall to heal which can't be done until the danger of the ureter swelling shut has passed, which varies.  You may see some blood in your urine while the stent is in place and a few days afterwards. Do not be alarmed, even if the urine was clear for a while. Get off your feet and drink lots of fluids until clearing occurs. If you start to pass clots or don't improve, call us.  DIET: You may return to your normal diet immediately. Because of the raw surface of your bladder, alcohol, spicy foods, acid type foods and drinks with caffeine may cause irritation or frequency and should be used in moderation. To keep your urine flowing freely and to avoid constipation, drink  plenty of fluids during the day ( 8-10 glasses ). Tip: Avoid cranberry juice because it is very acidic.  ACTIVITY: Your physical activity doesn't need to be restricted. However, if you are very active, you may see some blood in your urine. We suggest that you reduce your activity under these circumstances until the bleeding has stopped.  BOWELS: It is important to keep your bowels regular during the postoperative period. Straining with bowel movements can cause bleeding. A bowel movement every other day is reasonable. Use a mild laxative if needed, such as Milk of Magnesia 2-3 tablespoons, or 2 Dulcolax tablets. Call if you continue to have problems. If you have been taking narcotics for pain, before, during or after your surgery, you may be constipated. Take a laxative if necessary.   MEDICATION: You should resume your pre-surgery medications unless told not to. You may take oxybutynin or flomax if prescribed for bladder spasms or discomfort from the stent Take pain medication as directed for pain refractory to conservative management  PROBLEMS YOU SHOULD REPORT TO [place]:  Fevers over 100.5 Fahrenheit.  Heavy bleeding, or clots ( See above notes about blood in urine  ).  Inability to urinate.  Drug reactions ( hives, rash, nausea, vomiting, diarrhea ).  Severe burning or pain with urination that is not improving.

## 2021-04-14 NOTE — Discharge Instructions (Signed)
Take Percocet as prescribed as needed for pain.  Follow-up with urology this morning.  Call the office to arrange an appointment for later today.  The contact information for [facility]/Dr. Diona Fanti has been provided in this discharge summary for you to call and make these arrangements.

## 2021-04-15 ENCOUNTER — Encounter (HOSPITAL_COMMUNITY): Payer: Self-pay | Admitting: Urology

## 2021-04-15 LAB — URINE CULTURE: Culture: NO GROWTH

## 2021-04-16 NOTE — Anesthesia Postprocedure Evaluation (Signed)
Anesthesia Post Note  Patient: John Buck  Procedure(s) Performed: CYSTOSCOPY WITH RETROGRADE PYELOGRAM, URETEROSCOPY AND STENT PLACEMENT (Bilateral Urethra)     Patient location during evaluation: PACU Anesthesia Type: General Level of consciousness: awake Pain management: pain level controlled Vital Signs Assessment: post-procedure vital signs reviewed and stable Respiratory status: spontaneous breathing Cardiovascular status: stable Postop Assessment: no apparent nausea or vomiting Anesthetic complications: no   No complications documented.  Last Vitals:  Vitals:   04/14/21 2145 04/14/21 2148  BP:  111/75  Pulse: 85 96  Resp:  20  Temp:  37.7 C  SpO2: 96% 98%    Last Pain:  Vitals:   04/14/21 2148  TempSrc:   PainSc: 0-No pain   Pain Goal:                   [name]

## 2021-05-08 ENCOUNTER — Ambulatory Visit: Payer: 59 | Admitting: Podiatry

## 2021-05-08 ENCOUNTER — Other Ambulatory Visit: Payer: Self-pay

## 2021-05-08 DIAGNOSIS — R202 Paresthesia of skin: Secondary | ICD-10-CM | POA: Diagnosis not present

## 2021-05-08 DIAGNOSIS — M792 Neuralgia and neuritis, unspecified: Secondary | ICD-10-CM | POA: Diagnosis not present

## 2021-05-08 DIAGNOSIS — R200 Anesthesia of skin: Secondary | ICD-10-CM | POA: Diagnosis not present

## 2021-05-08 MED ORDER — GABAPENTIN 300 MG PO CAPS: 300.0000 mg | ORAL_CAPSULE | Freq: Three times a day (TID) | ORAL | 3 refills | Status: DC

## 2021-05-12 ENCOUNTER — Encounter: Payer: Self-pay | Admitting: Podiatry

## 2021-05-12 NOTE — Progress Notes (Signed)
Subjective:  Patient ID: John Buck, male    DOB: 1956-11-12,  MRN: [id number]  Chief Complaint  Patient presents with  . Numbness    Numbness in toes PT stated that it has not gotten any better but its not worse he still has the numbness in toes and his feet are still staying cold at night.    65 y.o. male presents with the above complaint.  Patient presents with follow-up of bilateral numbness tingling of the toes.  She patient states is doing about the same.  He did not get any phone call from nerve conduction study.  He would like for me to reorder it.  He has not been taking anything else for it.  He would like to discuss taking starting gabapentin.  He denies any other acute complaints   Review of Systems: Negative except as noted in the HPI. Denies N/V/F/Ch.  Past Medical History:  Diagnosis Date  . Arthritis    osteoarthritis  . History of kidney stones   . Hypertension    pt denies    Current Outpatient Medications:  .  gabapentin (NEURONTIN) 300 MG capsule, Take 1 capsule (300 mg total) by mouth 3 (three) times daily., Disp: 90 capsule, Rfl: 3 .  amLODipine (NORVASC) 5 MG tablet, Take 5 mg by mouth daily., Disp: , Rfl:  .  aspirin EC 81 MG tablet, Take 81 mg by mouth daily., Disp: , Rfl:  .  cephALEXin (KEFLEX) 500 MG capsule, Take 1 capsule (500 mg total) by mouth 2 (two) times daily., Disp: 10 capsule, Rfl: 0 .  colestipol (COLESTID) 1 g tablet, Take 1 g by mouth at bedtime., Disp: , Rfl:  .  glucosamine-chondroitin 500-400 MG tablet, Take 1 tablet by mouth daily as needed (joint discomfort)., Disp: , Rfl:  .  hydrochlorothiazide (HYDRODIURIL) 25 MG tablet, TAKE 1/2 TABLET EVERY DAY (Patient taking differently: Take 12.5 mg by mouth daily.), Disp: 45 tablet, Rfl: 0 .  hyoscyamine (LEVSIN SL) 0.125 MG SL tablet, Place 0.125 mg under the tongue 3 (three) times daily as needed for cramping. , Disp: , Rfl:  .  Omega-3 Fatty Acids (FISH OIL) 1000 MG CAPS, Take 1,000 mg by  mouth daily., Disp: , Rfl:  .  ondansetron (ZOFRAN ODT) 8 MG disintegrating tablet, Take 1 tablet (8 mg total) by mouth every 8 (eight) hours as needed for nausea or vomiting., Disp: 10 tablet, Rfl: 0 .  ondansetron (ZOFRAN) 4 MG tablet, Take 1 tablet (4 mg total) by mouth daily as needed for nausea or vomiting., Disp: 10 tablet, Rfl: 0 .  oxyCODONE-acetaminophen (PERCOCET) 5-325 MG tablet, Take 1-2 tablets by mouth every 6 (six) hours as needed., Disp: 20 tablet, Rfl: 0 .  zinc gluconate 50 MG tablet, Take 50 mg by mouth daily., Disp: , Rfl:   Social History   Tobacco Use  Smoking Status Never Smoker  Smokeless Tobacco Never Used    Allergies  Allergen Reactions  . Butorphanol Tartrate     REACTION: Hallucinations  . Hydromorphone Hcl Itching    REACTION: itching   Objective:  There were no vitals filed for this visit. There is no height or weight on file to calculate BMI. Constitutional Well developed. Well nourished.  Vascular Dorsalis pedis pulses palpable bilaterally. Posterior tibial pulses palpable bilaterally. Capillary refill normal to all digits.  No cyanosis or clubbing noted. Pedal hair growth normal.  Neurologic Normal speech. Oriented to person, place, and time. Decreased sensation to light touch grossly  present bilaterally.  Symes monofilament testing shows decreased sensation to all the digital tips up to the metatarsal fat pad.  Protective sensation is not intact.  Negative Tinel's sign.  No tarsal tunnel syndrome.  No common peroneal nerve compression.  Dermatologic Nails within normal limits Skin within normal limits  Orthopedic: Normal joint ROM without pain or crepitus bilaterally. No visible deformities. No bony tenderness.   Radiographs: None Assessment:   1. Numbness and tingling   2. Neuritis    Plan:  Patient was evaluated and treated and all questions answered.  Numbness tingling with underlying neuritis/neuropathy from unknown etiology -I  explained the patient the etiology of neuritis and various treatment options were discussed.  This is affecting all the digits likely polyneuropathy is involved.  Patient does not have any history of lower lumbar arthritis that he is aware of or any history of sciatica or nerve impingement.  At this time I believe patient will benefit from EMG/nerve conduction study to rule out any kind of compression to the nerve. -Reorder EMG and nerve conduction studies were ordered. -He is a prediabetic from his most recent A1c.  I encouraged him to follow-up with his primary care doctor -I will see him back again in few weeks to discuss the results.  Patient states understanding. -I will start him on low-dose of gabapentin.  No follow-ups on file.

## 2021-05-15 ENCOUNTER — Telehealth: Payer: Self-pay | Admitting: Podiatry

## 2021-05-15 NOTE — Telephone Encounter (Signed)
Patient wanted to know which office his referral was suppose to go. Please advise

## 2021-05-26 ENCOUNTER — Encounter: Payer: Self-pay | Admitting: Neurology

## 2021-05-26 ENCOUNTER — Other Ambulatory Visit: Payer: Self-pay | Admitting: Podiatry

## 2021-05-26 ENCOUNTER — Telehealth: Payer: Self-pay | Admitting: Podiatry

## 2021-05-26 NOTE — Telephone Encounter (Signed)
Patient calling to inform Dr. Posey Pronto that he needs a referral to schedule his follow up with [facility].

## 2021-05-26 NOTE — Telephone Encounter (Signed)
Faxed referral order to [facility] 05/26/21, received successful confirmation

## 2021-06-15 ENCOUNTER — Encounter: Payer: Self-pay | Admitting: Podiatry

## 2021-06-16 ENCOUNTER — Other Ambulatory Visit: Payer: Self-pay | Admitting: Podiatry

## 2021-06-16 MED ORDER — GABAPENTIN 300 MG PO CAPS: 300.0000 mg | ORAL_CAPSULE | Freq: Three times a day (TID) | ORAL | 3 refills | Status: AC

## 2021-06-19 ENCOUNTER — Ambulatory Visit: Payer: 59 | Admitting: Podiatry

## 2021-07-07 ENCOUNTER — Other Ambulatory Visit: Payer: Self-pay

## 2021-07-07 ENCOUNTER — Ambulatory Visit (INDEPENDENT_AMBULATORY_CARE_PROVIDER_SITE_OTHER): Payer: Medicare Other | Admitting: Neurology

## 2021-07-07 DIAGNOSIS — M792 Neuralgia and neuritis, unspecified: Secondary | ICD-10-CM

## 2021-07-07 DIAGNOSIS — R200 Anesthesia of skin: Secondary | ICD-10-CM

## 2021-07-07 DIAGNOSIS — R202 Paresthesia of skin: Secondary | ICD-10-CM | POA: Diagnosis not present

## 2021-07-07 DIAGNOSIS — G629 Polyneuropathy, unspecified: Secondary | ICD-10-CM

## 2021-07-07 NOTE — Procedures (Signed)
[facility]  [address], [address]  [place], [place] [address] Tel: [phone] Fax:  [phone] Test Date:  07/07/2021  Patient: John Buck DOB: [birth year] Physician: Narda Amber, DO  Sex: Male Height: '5\' 10"'$  Ref Phys: Boneta Lucks, DPM  ID#: [id number]   Technician:    Patient Complaints: This is a 65 year old man referred for evaluation of bilateral feet numbness and tingling.  NCV & EMG Findings: Extensive electrodiagnostic testing of the right lower extremity and additional studies of the left shows:  Bilateral superficial peroneal and right sural sensory responses are absent.  Left sural sensory responses within normal limits. Bilateral peroneal and tibial motor responses are within normal limits. Bilateral tibial H reflex studies are within normal limits. There is no evidence of active or chronic motor axonal loss changes affecting any of the tested muscles.  Motor unit configuration and recruitment pattern is within normal limits.  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   ___________________________ Narda Amber, DO    Nerve Conduction Studies Anti Sensory Summary Table   Stim Site NR Peak (ms) Norm Peak (ms) P-T Amp (V) Norm P-T Amp  Left Sup Peroneal Anti Sensory (Ant Lat Mall)  32C  12 cm NR  <4.6  >3  Right Sup Peroneal Anti Sensory (Ant Lat Mall)  32C  12 cm NR  <4.6  >3  Left Sural Anti Sensory (Lat Mall)  32C  Calf    2.2 <4.6 5.2 >3  Right Sural Anti Sensory (Lat Mall)  32C  Calf NR  <4.6  >3   Motor Summary Table   Stim Site NR Onset (ms) Norm Onset (ms) O-P Amp (mV) Norm O-P Amp Site1 Site2 Delta-0 (ms) Dist (cm) Vel (m/s) Norm Vel (m/s)  Left Peroneal Motor (Ext Dig Brev)  32C  Ankle    4.0 <6.0 5.1 >2.5 B Fib Ankle 7.6 39.0 51 >40  B Fib    11.6  4.3  Poplt B Fib 1.7 8.0 47 >40  Poplt    13.3  4.0          Right Peroneal Motor (Ext Dig Brev)  32C  Ankle    3.3 <6.0 5.6 >2.5 B Fib Ankle 7.7 37.0 48 >40  B Fib    11.0  5.3  Poplt B Fib 1.3 7.0 54 >40  Poplt    12.3  5.2         Left Tibial Motor (Abd Hall Brev)  32C  Ankle    3.9 <6.0 8.0 >4 Knee Ankle 9.5 42.0 44 >40  Knee    13.4  6.5         Right Tibial Motor (Abd Hall Brev)  32C  Ankle    2.4 <6.0 7.3 >4 Knee Ankle 10.3 42.0 41 >40  Knee    12.7  4.0          H Reflex Studies   NR H-Lat (ms) Lat Norm (ms) L-R H-Lat (ms)  Left Tibial (Gastroc)  32C     31.16 <35 0.00  Right Tibial (Gastroc)  32C     31.16 <35 0.00   EMG   Side Muscle Ins Act Fibs Psw Fasc Number Recrt Dur Dur. Amp Amp. Poly Poly. Comment  Right AntTibialis Nml Nml Nml Nml Nml Nml Nml Nml Nml Nml Nml Nml N/A  Right Gastroc Nml Nml Nml Nml Nml Nml Nml Nml Nml Nml Nml Nml  N/A  Right Flex Dig Long Nml Nml Nml Nml Nml Nml Nml Nml Nml Nml Nml Nml N/A  Right RectFemoris Nml Nml Nml Nml Nml Nml Nml Nml Nml Nml Nml Nml N/A  Right GluteusMed Nml Nml Nml Nml Nml Nml Nml Nml Nml Nml Nml Nml N/A  Left AntTibialis Nml Nml Nml Nml Nml Nml Nml Nml Nml Nml Nml Nml N/A  Left Gastroc Nml Nml Nml Nml Nml Nml Nml Nml Nml Nml Nml Nml N/A  Left Flex Dig Long Nml Nml Nml Nml Nml Nml Nml Nml Nml Nml Nml Nml N/A  Left RectFemoris Nml Nml Nml Nml Nml Nml Nml Nml Nml Nml Nml Nml N/A  Left GluteusMed Nml Nml Nml Nml Nml Nml Nml Nml Nml Nml Nml Nml N/A      Waveforms:

## 2021-07-17 ENCOUNTER — Other Ambulatory Visit: Payer: Self-pay

## 2021-07-17 ENCOUNTER — Ambulatory Visit (INDEPENDENT_AMBULATORY_CARE_PROVIDER_SITE_OTHER): Payer: 59 | Admitting: Podiatry

## 2021-07-17 DIAGNOSIS — M792 Neuralgia and neuritis, unspecified: Secondary | ICD-10-CM

## 2021-07-17 DIAGNOSIS — R202 Paresthesia of skin: Secondary | ICD-10-CM | POA: Diagnosis not present

## 2021-07-17 DIAGNOSIS — R200 Anesthesia of skin: Secondary | ICD-10-CM | POA: Diagnosis not present

## 2021-07-17 NOTE — Progress Notes (Signed)
Subjective:  Patient ID: John Buck, male    DOB: June 23, 1956,  MRN: [id number]  Chief Complaint  Patient presents with   Follow-up    Bilateral numbness in toes follow up. Pt states the gabapentin is helping but he is still having numbness.     65 y.o. male presents with the above complaint.  Patient presents with follow-up of bilateral numbness tingling of the toes.  He states that he got his nerve conduction study done.  He wanted to go over the results.  He denies any other acute complaints   Review of Systems: Negative except as noted in the HPI. Denies N/V/F/Ch.  Past Medical History:  Diagnosis Date   Arthritis    osteoarthritis   History of kidney stones    Hypertension    pt denies    Current Outpatient Medications:    amLODipine (NORVASC) 5 MG tablet, Take 5 mg by mouth daily., Disp: , Rfl:    aspirin EC 81 MG tablet, Take 81 mg by mouth daily., Disp: , Rfl:    cephALEXin (KEFLEX) 500 MG capsule, Take 1 capsule (500 mg total) by mouth 2 (two) times daily., Disp: 10 capsule, Rfl: 0   colestipol (COLESTID) 1 g tablet, Take 1 g by mouth at bedtime., Disp: , Rfl:    gabapentin (NEURONTIN) 300 MG capsule, Take 1 capsule (300 mg total) by mouth 3 (three) times daily., Disp: 90 capsule, Rfl: 3   glucosamine-chondroitin 500-400 MG tablet, Take 1 tablet by mouth daily as needed (joint discomfort)., Disp: , Rfl:    hydrochlorothiazide (HYDRODIURIL) 25 MG tablet, TAKE 1/2 TABLET EVERY DAY (Patient taking differently: Take 12.5 mg by mouth daily.), Disp: 45 tablet, Rfl: 0   hydrocortisone (ANUSOL-HC) 2.5 % rectal cream, Place rectally 2 (two) times daily., Disp: , Rfl:    hyoscyamine (LEVSIN SL) 0.125 MG SL tablet, Place 0.125 mg under the tongue 3 (three) times daily as needed for cramping. , Disp: , Rfl:    Omega-3 Fatty Acids (FISH OIL) 1000 MG CAPS, Take 1,000 mg by mouth daily., Disp: , Rfl:    ondansetron (ZOFRAN ODT) 8 MG disintegrating tablet, Take 1 tablet (8 mg total) by  mouth every 8 (eight) hours as needed for nausea or vomiting., Disp: 10 tablet, Rfl: 0   ondansetron (ZOFRAN) 4 MG tablet, Take 1 tablet (4 mg total) by mouth daily as needed for nausea or vomiting., Disp: 10 tablet, Rfl: 0   oxyCODONE-acetaminophen (PERCOCET) 5-325 MG tablet, Take 1-2 tablets by mouth every 6 (six) hours as needed., Disp: 20 tablet, Rfl: 0   promethazine (PHENERGAN) 25 MG tablet, Take 25 mg by mouth every 6 (six) hours as needed., Disp: , Rfl:    sildenafil (REVATIO) 20 MG tablet, SMARTSIG:1-5 Tablet(s) By Mouth PRN, Disp: , Rfl:    zinc gluconate 50 MG tablet, Take 50 mg by mouth daily., Disp: , Rfl:   Social History   Tobacco Use  Smoking Status Never  Smokeless Tobacco Never    Allergies  Allergen Reactions   Butorphanol Tartrate     REACTION: Hallucinations   Hydromorphone Hcl Itching    REACTION: itching   Objective:  There were no vitals filed for this visit. There is no height or weight on file to calculate BMI. Constitutional Well developed. Well nourished.  Vascular Dorsalis pedis pulses palpable bilaterally. Posterior tibial pulses palpable bilaterally. Capillary refill normal to all digits.  No cyanosis or clubbing noted. Pedal hair growth normal.  Neurologic Normal speech. Oriented  to person, place, and time. Decreased sensation to light touch grossly present bilaterally.  Symes monofilament testing shows decreased sensation to all the digital tips up to the metatarsal fat pad.  Protective sensation is not intact.  Negative Tinel's sign.  No tarsal tunnel syndrome.  No common peroneal nerve compression.  Dermatologic Nails within normal limits Skin within normal limits  Orthopedic: Normal joint ROM without pain or crepitus bilaterally. No visible deformities. No bony tenderness.   Radiographs: None Assessment:   1. Numbness and tingling   2. Neuritis     Plan:  Patient was evaluated and treated and all questions answered.  Numbness  tingling with underlying neuritis/neuropathy from unknown etiology -I explained the patient the etiology of neuritis and various treatment options were discussed.  This is affecting all the digits likely polyneuropathy is involved.  Patient does not have any history of lower lumbar arthritis that he is aware of or any history of sciatica or nerve impingement.  -EMG studies and nerve conduction study was discussed with the patient.  Patient does have polyneuropathy.  I discussed with him that it does not appear to be any kind of compression to the nerve however if he would like further information he can always follow-up with a neurologist.  Patient would like to further discuss with the neurologist I will place in a referral for him to be seen. -He will continue gabapentin as this seems to help him.  No follow-ups on file.

## 2021-09-28 ENCOUNTER — Telehealth: Payer: Self-pay | Admitting: *Deleted

## 2021-09-28 NOTE — Telephone Encounter (Signed)
[facility] is calling because patient is needing a referral for patient in order to be seen. Please advise.

## 2021-09-29 ENCOUNTER — Other Ambulatory Visit: Payer: Self-pay | Admitting: Podiatry

## 2021-09-29 ENCOUNTER — Telehealth: Payer: Self-pay | Admitting: Podiatry

## 2021-09-29 DIAGNOSIS — R202 Paresthesia of skin: Secondary | ICD-10-CM

## 2021-09-29 DIAGNOSIS — R200 Anesthesia of skin: Secondary | ICD-10-CM

## 2021-09-29 NOTE — Telephone Encounter (Signed)
John Buck from [facility] is calling in reference to the referral that was sent to them. She advise patient just had a nerve conduction on 07/07/21. They just want to be sure that the patient is needing to have another before they schedule it. Please advise.

## 2021-09-29 NOTE — Telephone Encounter (Signed)
Faxed referral to [facility], received confirmation-09/29/21

## 2021-10-13 ENCOUNTER — Telehealth: Payer: Self-pay | Admitting: Podiatry

## 2021-10-13 NOTE — Telephone Encounter (Signed)
Patient is upset that he referral to Neurology was sent as a nerve conduction study instead of a consultation. Therefore the referral was closed because he had the nerve conduction study done already in August. (See previous note) patient was under the impression that he was suppose to have a consultation with neurology and not a test. He states he was told that there was nothing else that you can due so he cancelled the appt on 11/11 with [place] because he was under the impression that he did not need to come back to [place] but go to neurology. Please call to discuss.

## 2021-10-14 ENCOUNTER — Other Ambulatory Visit: Payer: Self-pay | Admitting: Podiatry

## 2021-10-14 DIAGNOSIS — R202 Paresthesia of skin: Secondary | ICD-10-CM

## 2021-10-14 DIAGNOSIS — R200 Anesthesia of skin: Secondary | ICD-10-CM

## 2021-10-14 NOTE — Telephone Encounter (Signed)
I reached out to neurology and he declined the referral because they could not get him in until January. He wanted an appointment prior to that because he had meet his deductible/ out of pocket but they did not have anything available. I did verify that they did not have anything sooner. Just an [place]

## 2021-10-16 ENCOUNTER — Ambulatory Visit: Payer: 59 | Admitting: Podiatry

## 2022-09-06 ENCOUNTER — Telehealth: Payer: Self-pay | Admitting: Podiatry

## 2022-09-06 DIAGNOSIS — M792 Neuralgia and neuritis, unspecified: Secondary | ICD-10-CM

## 2022-09-06 DIAGNOSIS — R200 Anesthesia of skin: Secondary | ICD-10-CM

## 2022-09-06 NOTE — Telephone Encounter (Signed)
Pt called asking if you could please send over an new referral to  [facility] to see the provder not for testing as pt had the test done last yr. The referral that was sent last year has expired and pt has family matters and could not get in last yr.

## 2022-09-07 NOTE — Telephone Encounter (Signed)
Notified pt that referral was in.

## 2022-09-14 ENCOUNTER — Encounter: Payer: Self-pay | Admitting: Neurology

## 2022-11-15 ENCOUNTER — Ambulatory Visit: Payer: Medicare Other | Admitting: Neurology

## 2022-11-15 ENCOUNTER — Other Ambulatory Visit (INDEPENDENT_AMBULATORY_CARE_PROVIDER_SITE_OTHER): Payer: Medicare Other

## 2022-11-15 ENCOUNTER — Encounter: Payer: Self-pay | Admitting: Neurology

## 2022-11-15 VITALS — BP 118/78 | HR 76 | Ht 69.5 in | Wt 210.6 lb

## 2022-11-15 DIAGNOSIS — G629 Polyneuropathy, unspecified: Secondary | ICD-10-CM | POA: Diagnosis not present

## 2022-11-15 LAB — FOLATE: Folate: 18.5 ng/mL (ref >5.9)

## 2022-11-15 LAB — VITAMIN B12: Vitamin B-12: 291 pg/mL (ref 211–911)

## 2022-11-15 LAB — TSH: TSH: 1.59 u[IU]/mL (ref 0.35–5.50)

## 2022-11-15 NOTE — Patient Instructions (Signed)
Happy holidays!  Check labs.  We will call you with the results.

## 2022-11-15 NOTE — Progress Notes (Signed)
[facility] Clinic Note - Initial Visit   Date: 11/15/2022   John Buck MRN: [id number] DOB: 1956/03/22   Dear Dr. Posey Pronto:  Thank you for your kind referral of John Buck for consultation of neuropathy. Although his history is well known to you, please allow [place] to reiterate it for the purpose of our medical record. The patient was accompanied to the clinic by self.     John Buck is a 66 y.o. right-handed male with hypertension presenting for evaluation of neuropathy.   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   I had extensive discussion with the patient regarding the pathogenesis, etiology, management, and natural course of neuropathy. Neuropathy tends to be slowly progressive, especially if a treatable etiology is not identified.  I would like to test for treatable causes of neuropathy. I discussed that in the vast majority of cases, despite checking for reversible causes, we are unable to find the underlying etiology and management is symptomatic.   No personal history of diabetes or alcohol use.  No family history of neuropathy.  If labs are normal, this is most likely idiopathic.  - Check TSH, vitamin B12, folate, copper, SPEP with IFE - Patient had questions regarding driving and discussed to stop driving, if he is unable to manipulate pedals safely.  He does not have any problems at this time.  - No problems with imbalance  Return to clinic as needed  ------------------------------------------------------------- History of present illness: Starting around 2-3 years ago, he began having numbness/tingling involving the toes of both feet.  Symptoms are constant without any specific triggers.  He sleeps with socks on his feet because of cold feet.  He takes gabapentin '300mg'$  three times daily which he feels helps some.  He has some imbalance, no falls. He walks  unassisted. No back pain or radicular leg pain. Nonsmoker and does not drink alcohol.  He is not diabetic.  He is retired from [employer]. He works one day a week at the [employer].    Out-side paper records, electronic medical record, and images have been reviewed where available and summarized as:  NCS/EMG of the legs 07/07/2021: The electrophysiologic findings are consistent with a sensory axonal polyneuropathy affecting the lower extremities, moderate in degree electrically.    Lab Results  Component Value Date   HGBA1C 5.8 (H) 03/13/2021   No results found for: "VITAMINB12" Lab Results  Component Value Date   TSH 1.81 03/12/2014   No results found for: "ESRSEDRATE", "POCTSEDRATE"  Past Medical History:  Diagnosis Date   Arthritis    osteoarthritis   History of kidney stones    Hypertension    pt denies    Past Surgical History:  Procedure Laterality Date   CYSTOSCOPY W/ RETROGRADES Bilateral 04/08/2021   Procedure: CYSTOSCOPY WITH RETROGRADE PYELOGRAM;  Surgeon: Franchot Gallo, MD;  Location: [facility];  Service: Urology;  Laterality: Bilateral;   CYSTOSCOPY WITH RETROGRADE PYELOGRAM, URETEROSCOPY AND STENT PLACEMENT Bilateral 04/14/2021   Procedure: CYSTOSCOPY WITH RETROGRADE PYELOGRAM, URETEROSCOPY AND STENT PLACEMENT;  Surgeon: Lucas Mallow, MD;  Location: [facility];  Service: Urology;  Laterality: Bilateral;   CYSTOSCOPY/URETEROSCOPY/HOLMIUM LASER/STENT PLACEMENT Bilateral 04/08/2021   Procedure: CYSTOSCOPY/URETEROSCOPY/HOLMIUM LASER/STENT PLACEMENT;  Surgeon: Franchot Gallo, MD;  Location: [facility];  Service: Urology;  Laterality: Bilateral;   EXTRACORPOREAL SHOCK WAVE LITHOTRIPSY Left 03/01/2019   Procedure: EXTRACORPOREAL SHOCK WAVE LITHOTRIPSY (ESWL);  Surgeon: Ceasar Mons, MD;  Location:  [facility];  Service: Urology;  Laterality: Left;   KNEE ARTHROSCOPY W/ ACL RECONSTRUCTION     x2   TOTAL KNEE ARTHROPLASTY Right 12/27/2016   Procedure: RIGHT TOTAL KNEE  ARTHROPLASTY;  Surgeon: Gaynelle Arabian, MD;  Location: [facility];  Service: Orthopedics;  Laterality: Right;     Medications:  Outpatient Encounter Medications as of 11/15/2022  Medication Sig   amLODipine (NORVASC) 5 MG tablet Take 5 mg by mouth daily.   colestipol (COLESTID) 1 g tablet Take 1 g by mouth at bedtime.   gabapentin (NEURONTIN) 300 MG capsule Take 1 capsule (300 mg total) by mouth 3 (three) times daily.   hydrochlorothiazide (HYDRODIURIL) 25 MG tablet TAKE 1/2 TABLET EVERY DAY (Patient taking differently: Take 12.5 mg by mouth daily.)   hydrocortisone (ANUSOL-HC) 2.5 % rectal cream Place rectally 2 (two) times daily.   hyoscyamine (LEVSIN SL) 0.125 MG SL tablet Place 0.125 mg under the tongue 3 (three) times daily as needed for cramping.    Omega-3 Fatty Acids (FISH OIL) 1000 MG CAPS Take 1,000 mg by mouth daily.   sildenafil (REVATIO) 20 MG tablet SMARTSIG:1-5 Tablet(s) By Mouth PRN   aspirin EC 81 MG tablet Take 81 mg by mouth daily. (Patient not taking: Reported on 11/15/2022)   cephALEXin (KEFLEX) 500 MG capsule Take 1 capsule (500 mg total) by mouth 2 (two) times daily. (Patient not taking: Reported on 11/15/2022)   glucosamine-chondroitin 500-400 MG tablet Take 1 tablet by mouth daily as needed (joint discomfort). (Patient not taking: Reported on 11/15/2022)   ondansetron (ZOFRAN ODT) 8 MG disintegrating tablet Take 1 tablet (8 mg total) by mouth every 8 (eight) hours as needed for nausea or vomiting. (Patient not taking: Reported on 11/15/2022)   oxyCODONE-acetaminophen (PERCOCET) 5-325 MG tablet Take 1-2 tablets by mouth every 6 (six) hours as needed.   promethazine (PHENERGAN) 25 MG tablet Take 25 mg by mouth every 6 (six) hours as needed. (Patient not taking: Reported on 11/15/2022)   zinc gluconate 50 MG tablet Take 50 mg by mouth daily. (Patient not taking: Reported on 11/15/2022)   No facility-administered encounter medications on file as of 11/15/2022.    Allergies:   Allergies  Allergen Reactions   Butorphanol Tartrate     REACTION: Hallucinations   Hydromorphone Hcl Itching    REACTION: itching    Family History: Family History  Problem Relation Age of Onset   Hypertension Mother    Colon cancer Father 10   Skin cancer Father    Seizures Father    Skin cancer Brother    Hypertension Brother     Social History: Social History   Tobacco Use   Smoking status: Never   Smokeless tobacco: Never   Tobacco comments:    Rarely a pipe  Vaping Use   Vaping Use: Never used  Substance Use Topics   Alcohol use: No   Drug use: No   Social History   Social History Narrative   Are you right handed or left handed? right   Caffeine 2 cups daily    What is your current occupation? retired   Do you live at home alone?   Who lives with you? wife   What type of home do you live in: 1 story or 2 story? One         Vital Signs:  BP 118/78   Pulse 76   Ht 5' 9.5" (1.765 m)   Wt 210 lb 9.6 oz (95.5 kg)   SpO2  96%   BMI 30.65 kg/m    Neurological Exam: MENTAL STATUS including orientation to time, place, person, recent and remote memory, attention span and concentration, language, and fund of knowledge is normal.  Speech is not dysarthric.  CRANIAL NERVES: II:  No visual field defects.   III-IV-VI: Pupils equal round and reactive to light.  Normal conjugate, extra-ocular eye movements in all directions of gaze.  No nystagmus.  No ptosis.   V:  Normal facial sensation.    VII:  Normal facial symmetry and movements.   VIII:  Normal hearing and vestibular function.   IX-X:  Normal palatal movement.   XI:  Normal shoulder shrug and head rotation.   XII:  Normal tongue strength and range of motion, no deviation or fasciculation.  MOTOR:  No atrophy, fasciculations or abnormal movements.  No pronator drift.   Upper Extremity:  Right  Left  Deltoid  5/5   5/5   Biceps  5/5   5/5   Triceps  5/5   5/5   Wrist extensors  5/5   5/5   Wrist  flexors  5/5   5/5   Finger extensors  5/5   5/5   Finger flexors  5/5   5/5   Dorsal interossei  5/5   5/5   Abductor pollicis  5/5   5/5   Tone ([place] scale)  0  0   Lower Extremity:  Right  Left  Hip flexors  5/5   5/5   Knee flexors  5/5   5/5   Knee extensors  5/5   5/5   Dorsiflexors  5/5   5/5   Plantarflexors  5/5   5/5   Toe extensors  5/5   5/5   Toe flexors  5/5   5/5   Tone ([place] scale)  0  0   MSRs:                                           Right        Left brachioradialis 2+  2+  biceps 2+  2+  triceps 2+  2+  patellar 2+  2+  ankle jerk 2+  2+  Hoffman no  no  plantar response down  down   SENSORY:  Diminished vibration at the toes bilaterally.  Temperature intact.  Romberg's sign absent.   COORDINATION/GAIT: Normal finger-to- nose-finger.  Intact rapid alternating movements bilaterally.  Gait narrow based and stable. Tandem and stressed gait intact.   Total time spent reviewing records, interview, history/exam, documentation, and coordination of care on day of encounter:  40 min    Thank you for allowing me to participate in patient's care.  If I can answer any additional questions, I would be pleased to do so.    Sincerely,    Keshawn Sundberg K. Posey Pronto, DO

## 2022-11-20 LAB — PROTEIN ELECTROPHORESIS, SERUM
Albumin ELP: 4.2 g/dL (ref 3.8–4.8)
Alpha 1: 0.2 g/dL (ref 0.2–0.3)
Alpha 2: 0.5 g/dL (ref 0.5–0.9)
Beta 2: 0.4 g/dL (ref 0.2–0.5)
Beta Globulin: 0.4 g/dL (ref 0.4–0.6)
Gamma Globulin: 1.1 g/dL (ref 0.8–1.7)
Total Protein: 6.9 g/dL (ref 6.1–8.1)

## 2022-11-20 LAB — IMMUNOFIXATION ELECTROPHORESIS
IgG (Immunoglobin G), Serum: 1244 mg/dL (ref 600–1540)
IgM, Serum: 41 mg/dL — ABNORMAL LOW (ref 50–300)
Immunoglobulin A: 285 mg/dL (ref 70–320)

## 2022-11-20 LAB — COPPER, SERUM: Copper: 103 ug/dL (ref 70–175)

## 2023-01-17 IMAGING — CT CT RENAL STONE PROTOCOL
2 of 4 series · 16 of 46 positions shown, 18 images · non-contrast
Comparison: CT abdomen pelvis dated 04/08/2021.

CLINICAL DATA: 65-year-old male with flank pain. Concern for kidney
stone.

EXAM:
CT ABDOMEN AND PELVIS WITHOUT CONTRAST
TECHNIQUE: Multidetector CT imaging of the abdomen and pelvis was performed
following the standard protocol without IV contrast.

[Series 3: ap without · axial · non-contrast · 0.77mm/px · z∈[+893,+1263]mm · 13 of 82 slices shown, 15 images]
[im 4/82  soft-tissue]
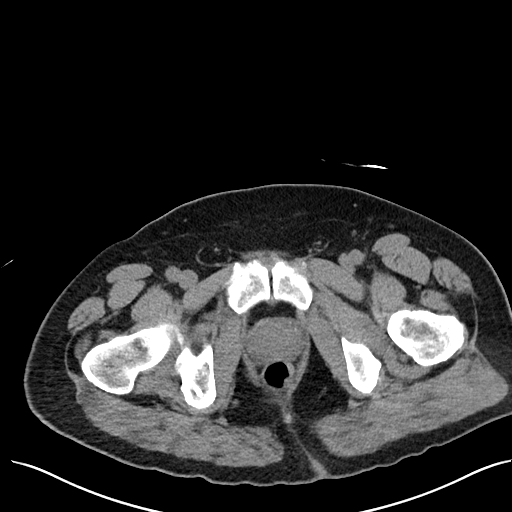
[im 4/82  bone]
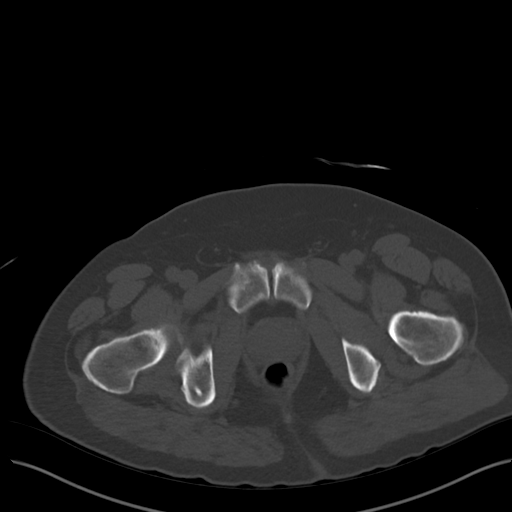
[im 12/82  soft-tissue]
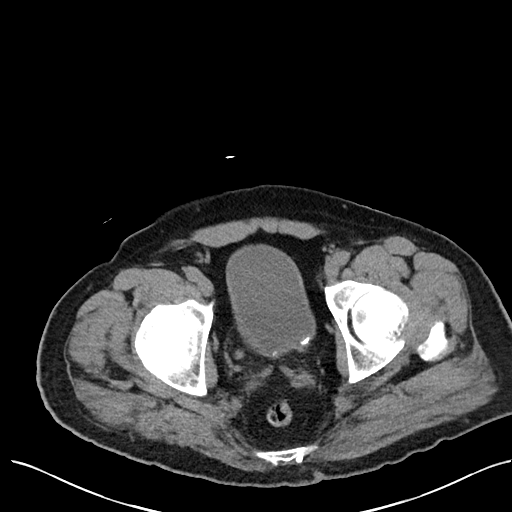
[im 16/82  soft-tissue]
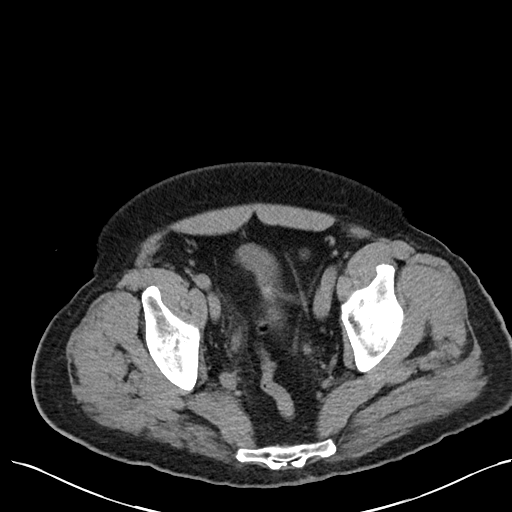
[im 24/82  soft-tissue]
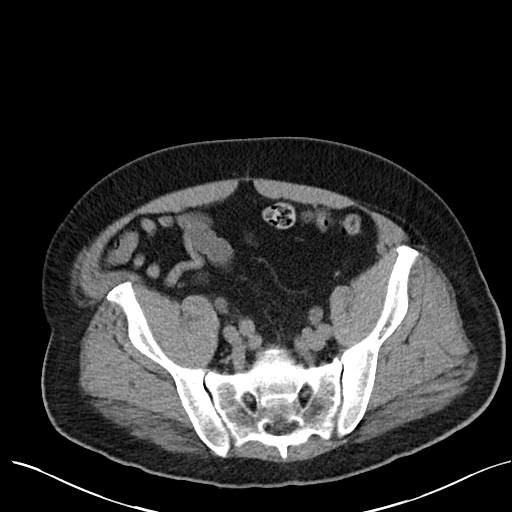
[im 28/82  soft-tissue]
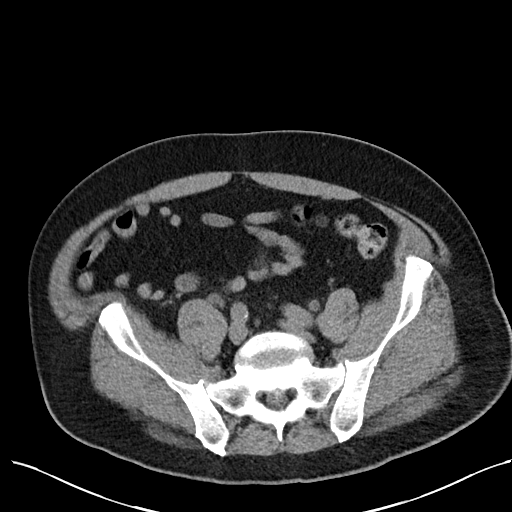
[im 35/82  soft-tissue]
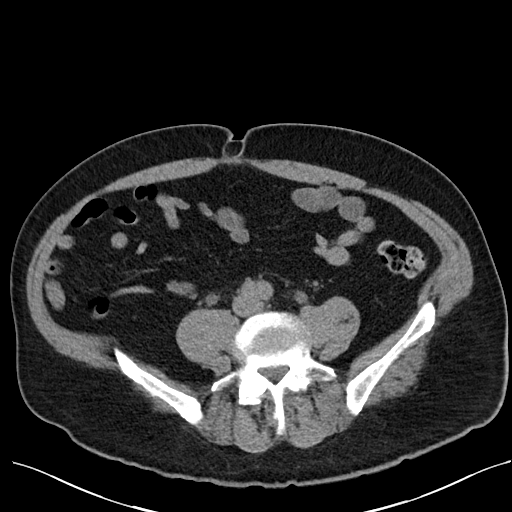
[im 43/82  soft-tissue]
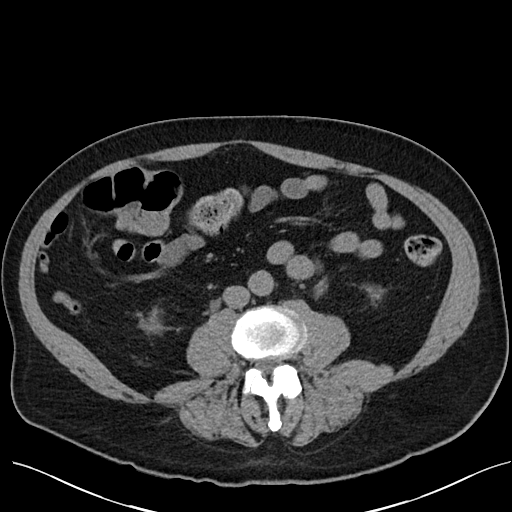
[im 47/82  soft-tissue]
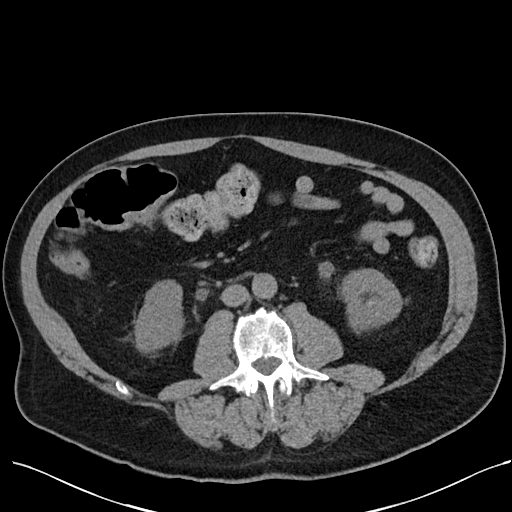
[im 55/82  soft-tissue]
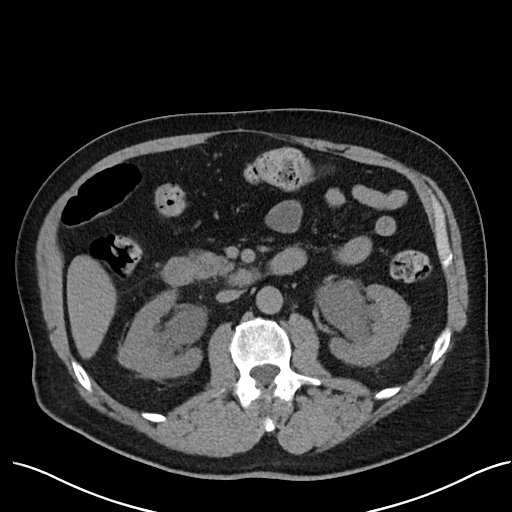
[im 55/82  bone]
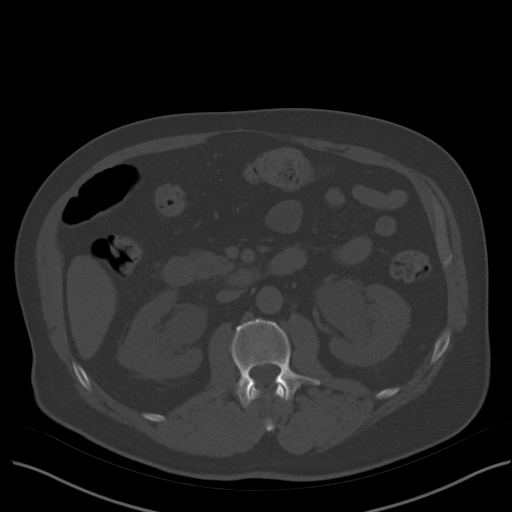
[im 58/82  soft-tissue]
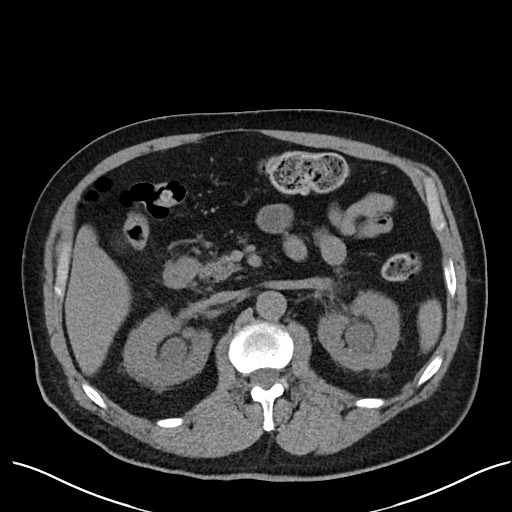
[im 66/82  soft-tissue]
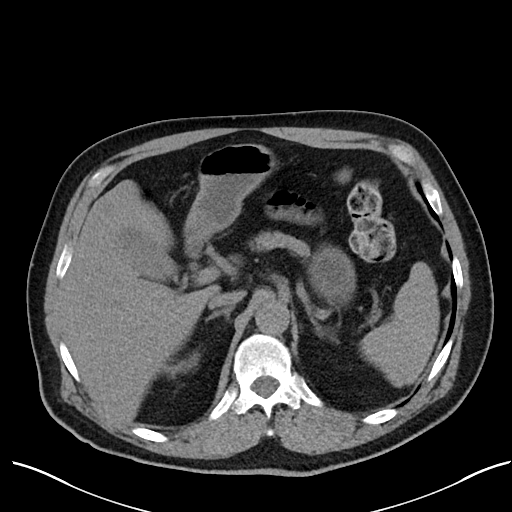
[im 70/82  soft-tissue]
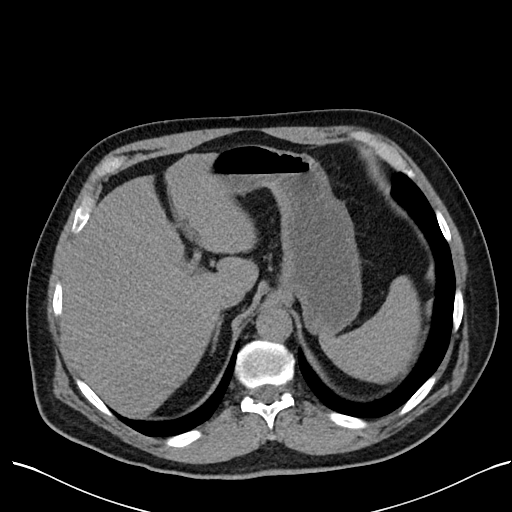
[im 78/82  soft-tissue]
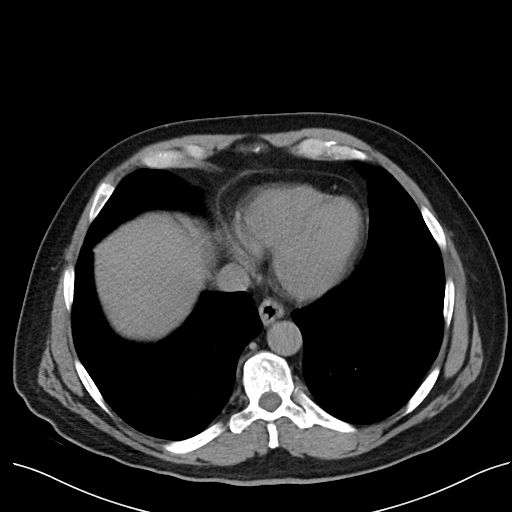

[Series 5: cor · coronal · 0.79mm/px · 3 of 100 slices shown]
[im 34/100  soft-tissue]
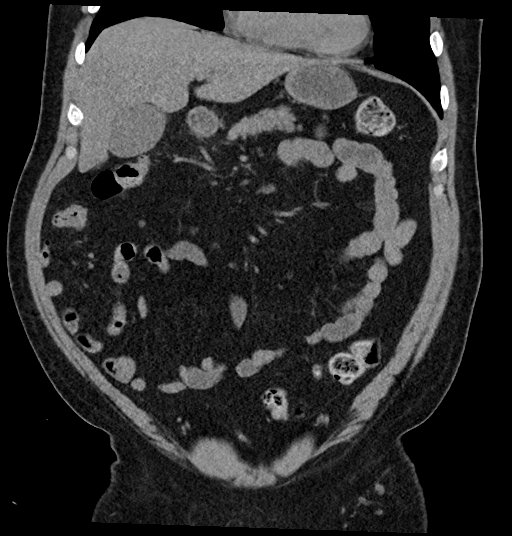
[im 45/100  soft-tissue]
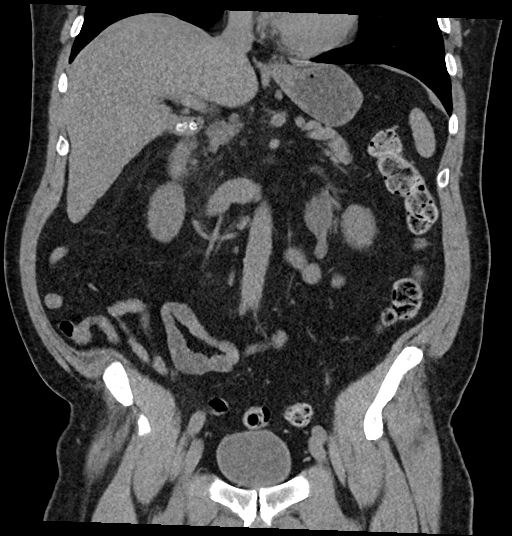
[im 56/100  soft-tissue]
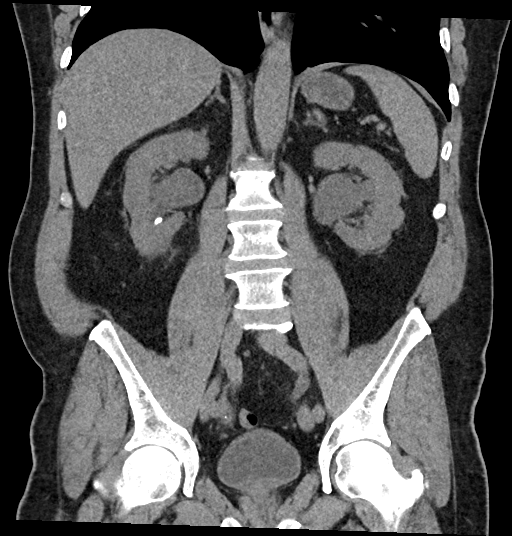

[16 of 46 positions shown; findings below may reference images not displayed]

FINDINGS: Evaluation of this exam is limited in the absence of intravenous
contrast.

Lower chest: The visualized lung bases are clear.

No intra-abdominal free air or free fluid.

Hepatobiliary: Probable mild fatty liver. No intrahepatic biliary
dilatation. Several liver cysts measure up to 3 cm. Multiple stones
in the neck of the gallbladder. No pericholecystic fluid or evidence
of acute cholecystitis by CT.

Pancreas: Unremarkable. No pancreatic ductal dilatation or
surrounding inflammatory changes.

Spleen: Normal in size without focal abnormality.

Adrenals/Urinary Tract: The adrenal glands unremarkable. There is a
7 mm stone or several adjacent stones in the distal right ureter.
There is similar degree of right hydronephrosis. The previously seen
stone in the upper pole of the right kidney is not visualized. The
previously seen stone in the interpolar right kidney appears
fragmented and smaller. There is a 4 mm stone along the right
posterior bladder wall adjacent to the right UVJ.

Several punctate nonobstructing left renal calculi as seen
previously. No interval change in the degree of left hydronephrosis
compared to the prior CT. There is an 8 mm stone along the left
posterior bladder wall adjacent to the left UVJ. Small pockets of
air within the bladder, likely related to recent instrumentation.

Stomach/Bowel: There is moderate stool throughout the colon. There
is no bowel obstruction or active inflammation. The appendix is
normal.

Vascular/Lymphatic: The abdominal aorta and IVC are unremarkable. No
portal venous gas. There is no adenopathy.

Reproductive: The prostate and seminal vesicles are grossly
unremarkable. No pelvic mass.

Other: Small fat containing umbilical hernia.

Musculoskeletal: Mild degenerative changes of the spine. No acute
osseous pathology.
IMPRESSION: 1. A 7 mm stone or several adjacent stones in the distal right
ureter with similar degree of right hydronephrosis. The previously
seen stone in the upper pole of the right kidney appears fragmented
and smaller.
2. A 4 mm stone along the right posterior bladder wall adjacent to
the right UVJ.
3. There is a 8 mm stone along the left posterior bladder wall
adjacent to the left UVJ. No interval change in the degree of left
hydronephrosis compared to the prior CT.
4. Cholelithiasis.
5. Moderate colonic stool burden. No bowel obstruction. Normal
appendix.

## 2023-04-28 ENCOUNTER — Other Ambulatory Visit: Payer: Self-pay

## 2023-04-28 ENCOUNTER — Encounter: Payer: Self-pay | Admitting: Interventional Cardiology

## 2023-04-28 ENCOUNTER — Ambulatory Visit: Payer: Medicare Other | Attending: Internal Medicine | Admitting: Interventional Cardiology

## 2023-04-28 VITALS — BP 106/64 | HR 77 | Ht 70.0 in | Wt 206.0 lb

## 2023-04-28 DIAGNOSIS — R072 Precordial pain: Secondary | ICD-10-CM | POA: Diagnosis not present

## 2023-04-28 DIAGNOSIS — I10 Essential (primary) hypertension: Secondary | ICD-10-CM

## 2023-04-28 MED ORDER — METOPROLOL TARTRATE 100 MG PO TABS: ORAL_TABLET | ORAL | 0 refills | Status: AC

## 2023-04-28 NOTE — Patient Instructions (Addendum)
Medication Instructions:  Your physician recommends that you continue on your current medications as directed. Please refer to the Current Medication list given to you today.  *If you need a refill on your cardiac medications before your next appointment, please call your pharmacy*   Lab Work: Lab work to be done today--BMP If you have labs (blood work) drawn today and your tests are completely normal, you will receive your results only by: MyChart Message (if you have MyChart) OR A paper copy in the mail If you have any lab test that is abnormal or we need to change your treatment, we will call you to review the results.   Testing/Procedures: Your physician has requested that you have cardiac CT. Cardiac computed tomography (CT) is a painless test that uses an x-ray machine to take clear, detailed pictures of your heart. For further information please visit [email or web address] Please follow instruction sheet as given.     Follow-Up: At [facility], you and your health needs are our priority.  As part of our continuing mission to provide you with exceptional heart care, we have created designated Provider Care Teams.  These Care Teams include your primary Cardiologist (physician) and Advanced Practice Providers (APPs -  Physician Assistants and Nurse Practitioners) who all work together to provide you with the care you need, when you need it.  We recommend signing up for the patient portal called "MyChart".  Sign up information is provided on this After Visit Summary.  MyChart is used to connect with patients for Virtual Visits (Telemedicine).  Patients are able to view lab/test results, encounter notes, upcoming appointments, etc.  Non-urgent messages can be sent to your provider as well.   To learn more about what you can do with MyChart, go to [email or web address].    Your next appointment:   Based on results  Provider:   Lance Muss, MD     Other Instructions     Your cardiac CT will be scheduled at one of the below locations:   [facility] [address] [place], [place] [address] [phone]  OR  [facility] [address] [place], [place] [address] [phone]  OR   [facility] [address] [place], [place] [address] [phone]  If scheduled at [facility], please arrive at the [facility] Entrance (Entrance C2) of [facility] 30 minutes prior to test start time. You can use the FREE valet parking offered at entrance C (encouraged to control the heart rate for the test)  Proceed to the [facility] Radiology Department (first floor) to check-in and test prep.  All radiology patients and guests should use entrance C2 at [facility], accessed from [facility], even though the hospital's physical address listed is [address]    If scheduled at [facility] or [facility], please arrive 15 mins early for check-in and test prep.   Please follow these instructions carefully (unless otherwise directed):  Hold all erectile dysfunction medications at least 3 days (72 hrs) prior to test. (Ie viagra, cialis, sildenafil, tadalafil, etc) We will administer nitroglycerin during this exam.   On the Night Before the Test: Be sure to Drink plenty of water. Do not consume any caffeinated/decaffeinated beverages or chocolate 12 hours prior to your test. Do not take any antihistamines 12 hours prior to your test.  On the Day of the Test: Drink plenty  of water until 1 hour prior to the test. Do not eat any food 1 hour prior to test. You may take your regular medications prior to the test.  Take metoprolol (Lopressor) two hours prior to test. If you take Furosemide/Hydrochlorothiazide/Spironolactone, please HOLD on the morning of the test.  *      After the Test: Drink plenty of water. After receiving IV contrast, you may experience a mild flushed feeling. This is normal. On occasion, you may experience a mild rash up to 24 hours after the test. This is not dangerous. If this occurs, you can take Benadryl 25 mg and increase your fluid intake. If you experience trouble breathing, this can be serious. If it is severe call 911 IMMEDIATELY. If it is mild, please call our office. If you take any of these medications: Glipizide/Metformin, Avandament, Glucavance, please do not take 48 hours after completing test unless otherwise instructed.  We will call to schedule your test 2-4 weeks out understanding that some insurance companies will need an authorization prior to the service being performed.   For non-scheduling related questions, please contact the cardiac imaging nurse navigator should you have any questions/concerns: Rockwell Alexandria, Cardiac Imaging Nurse Navigator Larey Brick, Cardiac Imaging Nurse Navigator [facility] Direct Office Dial: [phone]   For scheduling needs, including cancellations and rescheduling, please call [place], [phone].

## 2023-04-28 NOTE — Progress Notes (Signed)
Cardiology Office Note   Date:  04/28/2023   ID:  John, Buck 1956/10/28, MRN [id number]  PCP:  John Joe, MD    No chief complaint on file.  Chest pain  Wt Readings from Last 3 Encounters:  04/28/23 206 lb (93.4 kg)  11/15/22 210 lb 9.6 oz (95.5 kg)  04/14/21 202 lb 12.8 oz (92 kg)       History of Present Illness: John Buck is a 67 y.o. male who is being seen today for the evaluation of chest pain at the request of John, Vyvyan, MD.   He has a history of hypertension, hyperlipidemia and a family history of heart disease.  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  He had a mild pain on a few occasions while he was at work.  It eased up after a few hours.   Walks daily for 45 minutes.  No chest pain with the prolonged walk, but one day had some chest pain while active at work.  Denies : . Dizziness. Leg edema. Nitroglycerin use. Orthopnea. Palpitations. Paroxysmal nocturnal dyspnea. Shortness of breath. Syncope.     Past Medical History:  Diagnosis Date   Arthritis    osteoarthritis   History of kidney stones    Hypertension    pt denies    Past Surgical History:  Procedure Laterality Date   CYSTOSCOPY W/ RETROGRADES Bilateral 04/08/2021   Procedure: CYSTOSCOPY WITH RETROGRADE PYELOGRAM;  Surgeon: John Matar, MD;  Location: [facility];  Service: Urology;  Laterality: Bilateral;   CYSTOSCOPY WITH RETROGRADE PYELOGRAM, URETEROSCOPY AND STENT PLACEMENT Bilateral 04/14/2021   Procedure: CYSTOSCOPY WITH RETROGRADE PYELOGRAM, URETEROSCOPY AND STENT PLACEMENT;  Surgeon: John Elliot, MD;  Location: [facility];  Service: Urology;  Laterality: Bilateral;   CYSTOSCOPY/URETEROSCOPY/HOLMIUM LASER/STENT PLACEMENT Bilateral 04/08/2021   Procedure: CYSTOSCOPY/URETEROSCOPY/HOLMIUM LASER/STENT PLACEMENT;  Surgeon: John Matar, MD;  Location: [facility];  Service: Urology;  Laterality: Bilateral;   EXTRACORPOREAL SHOCK WAVE LITHOTRIPSY Left 03/01/2019   Procedure:  EXTRACORPOREAL SHOCK WAVE LITHOTRIPSY (ESWL);  Surgeon: John Paci, MD;  Location: [facility];  Service: Urology;  Laterality: Left;   KNEE ARTHROSCOPY W/ ACL RECONSTRUCTION     x2   TOTAL KNEE ARTHROPLASTY Right 12/27/2016   Procedure: RIGHT TOTAL KNEE ARTHROPLASTY;  Surgeon: John Gross, MD;  Location: [facility];  Service: Orthopedics;  Laterality: Right;     Current Outpatient Medications  Medication Sig Dispense Refill   amLODipine (NORVASC) 5 MG tablet Take 5 mg by mouth daily.     colestipol (COLESTID) 1 g tablet Take 1 g by mouth at bedtime.     Cyanocobalamin (VITAMIN B12) 1000 MCG TBCR 1 tablet Orally Once a day     gabapentin (NEURONTIN) 300 MG capsule Take 1 capsule (300 mg total) by mouth 3 (three) times daily. 90 capsule 3   hydrochlorothiazide (HYDRODIURIL) 25 MG tablet TAKE 1/2 TABLET EVERY DAY (Patient taking differently: Take 12.5 mg by mouth daily.) 45 tablet 0   hydrocortisone (ANUSOL-HC) 2.5 % rectal cream Place rectally 2 (two) times daily.     hyoscyamine (LEVSIN SL) 0.125 MG SL tablet Place 0.125 mg under the tongue 3 (three) times daily as needed for cramping.      Melatonin 10 MG CAPS as directed Orally As needed     Omega-3 Fatty Acids (FISH OIL) 1000 MG CAPS Take 1,000 mg by mouth daily.     sildenafil (REVATIO) 20 MG tablet SMARTSIG:1-5 Tablet(s) By Mouth PRN  aspirin EC 81 MG tablet Take 81 mg by mouth daily.     cephALEXin (KEFLEX) 500 MG capsule Take 1 capsule (500 mg total) by mouth 2 (two) times daily. 10 capsule 0   glucosamine-chondroitin 500-400 MG tablet Take 1 tablet by mouth daily as needed (joint discomfort).     ondansetron (ZOFRAN ODT) 8 MG disintegrating tablet Take 1 tablet (8 mg total) by mouth every 8 (eight) hours as needed for nausea or vomiting. 10 tablet 0   oxyCODONE-acetaminophen (PERCOCET) 5-325 MG tablet Take 1-2 tablets by mouth every 6 (six) hours as needed. 20 tablet 0   promethazine (PHENERGAN) 25 MG tablet Take 25 mg by  mouth every 6 (six) hours as needed.     zinc gluconate 50 MG tablet Take 50 mg by mouth daily.     No current facility-administered medications for this visit.    Allergies:   Butorphanol tartrate and Hydromorphone hcl    Social History:  The patient  reports that he has never smoked. He has never used smokeless tobacco. He reports that he does not drink alcohol and does not use drugs.   Family History:  The patient's family history includes Colon cancer (age of onset: 90) in his father; Hypertension in his brother and mother; Seizures in his father; Skin cancer in his brother and father.    ROS:  Please see the history of present illness.   Otherwise, review of systems are positive for chronic nausea- improved with colestipol.   All other systems are reviewed and negative.    PHYSICAL EXAM: VS:  BP 106/64   Pulse 77   Ht 5\' 10"  (1.778 m)   Wt 206 lb (93.4 kg)   SpO2 95%   BMI 29.56 kg/m  , BMI Body mass index is 29.56 kg/m. GEN: Well nourished, well developed, in no acute distress HEENT: normal Neck: no JVD, carotid bruits, or masses Cardiac: RRR; no murmurs, rubs, or gallops,no edema  Respiratory:  clear to auscultation bilaterally, normal work of breathing GI: soft, nontender, nondistended, + BS MS: no deformity or atrophy Skin: warm and dry, no rash Neuro:  Strength and sensation are intact Psych: euthymic mood, full affect   EKG:   The ekg ordered today demonstrates NSR, Q wave in lead 3, no ST changes   Recent Labs: 11/15/2022: TSH 1.59   Lipid Panel    Component Value Date/Time   CHOL 203 (H) 03/12/2014 0825   TRIG 223.0 (H) 03/12/2014 0825   HDL 39.90 03/12/2014 0825   CHOLHDL 5 03/12/2014 0825   VLDL 44.6 (H) 03/12/2014 0825   LDLCALC 119 (H) 03/12/2014 0825   LDLDIRECT 130.6 05/01/2013 4098     Other studies Reviewed: Additional studies/ records that were reviewed today with results demonstrating: LDL 130.   ASSESSMENT AND PLAN:  Chest pain:  Some typical and some atypical features.  Will plan for CTA coronaries to evaluate for obstructive coronary artery disease.  Toprol 100 mg x 1 prior to the scan.  Based on CTA coronaries, will also decide whether statin would be indicated. HTN: Well-controlled on amlodipine and HCTZ.    Current medicines are reviewed at length with the patient today.  The patient concerns regarding his medicines were addressed.  The following changes have been made:  No change  Labs/ tests ordered today include:  No orders of the defined types were placed in this encounter.   Recommend 150 minutes/week of aerobic exercise Low fat, low carb, high fiber diet recommended  Disposition:   FU based on CTA results   Signed, Lance Muss, MD  04/28/2023 2:01 PM    [facility] [address] [place], [place], [place]  [address] Phone: [phone]; Fax: [phone]

## 2023-04-29 LAB — BASIC METABOLIC PANEL
BUN/Creatinine Ratio: 18 (ref 10–24)
BUN: 16 mg/dL (ref 8–27)
CO2: 23 mmol/L (ref 20–29)
Calcium: 9.6 mg/dL (ref 8.6–10.2)
Chloride: 102 mmol/L (ref 96–106)
Creatinine, Ser: 0.9 mg/dL (ref 0.76–1.27)
Glucose: 118 mg/dL — ABNORMAL HIGH (ref 70–99)
Potassium: 4.1 mmol/L (ref 3.5–5.2)
Sodium: 141 mmol/L (ref 134–144)
eGFR: 93 mL/min/{1.73_m2} (ref >59)

## 2023-05-09 ENCOUNTER — Telehealth (HOSPITAL_COMMUNITY): Payer: Self-pay | Admitting: *Deleted

## 2023-05-09 NOTE — Telephone Encounter (Signed)
Reaching out to patient to offer assistance regarding upcoming cardiac imaging study; pt verbalizes understanding of appt date/time, parking situation and where to check in, pre-test NPO status and medications ordered, and verified current allergies; name and call back number provided for further questions should they arise  Larey Brick RN Navigator Cardiac Imaging [facility] [phone] office [phone] cell  Patient to hold his BP medications and take 100mg  metoprolol tartrate two hours prior to his cardiac CT scan.  He is aware to arrive at 1pm.

## 2023-05-10 ENCOUNTER — Ambulatory Visit (HOSPITAL_COMMUNITY)
Admission: RE | Admit: 2023-05-10 | Discharge: 2023-05-10 | Disposition: A | Discharge status: Home / Self Care | Payer: Medicare Other | Source: Ambulatory Visit | Attending: Interventional Cardiology | Admitting: Interventional Cardiology

## 2023-05-10 DIAGNOSIS — R072 Precordial pain: Secondary | ICD-10-CM | POA: Diagnosis present

## 2023-05-10 MED ORDER — NITROGLYCERIN 0.4 MG SL SUBL
SUBLINGUAL_TABLET | SUBLINGUAL | Status: AC
  Filled 2023-05-10: qty 2

## 2023-05-10 MED ORDER — NITROGLYCERIN 0.4 MG SL SUBL
0.8000 mg | SUBLINGUAL_TABLET | Freq: Once | SUBLINGUAL | Status: AC
  Administered 2023-05-10: 0.8 mg via SUBLINGUAL

## 2023-05-10 MED ORDER — IOHEXOL 350 MG/ML SOLN
95.0000 mL | Freq: Once | INTRAVENOUS | Status: AC | PRN
  Administered 2023-05-10: 95 mL via INTRAVENOUS

## 2023-07-26 ENCOUNTER — Ambulatory Visit: Payer: Medicare Other | Admitting: Internal Medicine

## 2023-10-07 ENCOUNTER — Other Ambulatory Visit: Payer: Self-pay | Admitting: Urology

## 2023-10-07 DIAGNOSIS — N402 Nodular prostate without lower urinary tract symptoms: Secondary | ICD-10-CM

## 2023-10-07 DIAGNOSIS — R972 Elevated prostate specific antigen [PSA]: Secondary | ICD-10-CM

## 2023-11-20 ENCOUNTER — Ambulatory Visit
Admission: RE | Admit: 2023-11-20 | Discharge: 2023-11-20 | Disposition: A | Discharge status: Home / Self Care | Payer: Medicare Other | Source: Ambulatory Visit | Attending: Urology | Admitting: Urology

## 2023-11-20 DIAGNOSIS — R972 Elevated prostate specific antigen [PSA]: Secondary | ICD-10-CM

## 2023-11-20 DIAGNOSIS — N402 Nodular prostate without lower urinary tract symptoms: Secondary | ICD-10-CM

## 2023-11-20 MED ORDER — GADOPICLENOL 0.5 MMOL/ML IV SOLN
10.0000 mL | Freq: Once | INTRAVENOUS | Status: AC | PRN
  Administered 2023-11-20: 10 mL via INTRAVENOUS

## 2024-11-28 ENCOUNTER — Other Ambulatory Visit (HOSPITAL_COMMUNITY): Payer: Self-pay | Admitting: Nurse Practitioner

## 2024-11-28 DIAGNOSIS — C61 Malignant neoplasm of prostate: Secondary | ICD-10-CM

## 2025-01-08 ENCOUNTER — Encounter (HOSPITAL_COMMUNITY): Payer: Self-pay

## 2025-01-08 ENCOUNTER — Ambulatory Visit (HOSPITAL_COMMUNITY)

## 2025-01-28 ENCOUNTER — Ambulatory Visit (HOSPITAL_COMMUNITY)
